# Patient Record
Sex: Male | Born: 1950 | Race: White | Hispanic: No | Marital: Married | Smoking: Former smoker
Health system: Southern US, Community
[De-identification: ages and names within clinical notes are randomized; demographics above are authoritative.]

## PROBLEM LIST (undated history)

## (undated) DIAGNOSIS — M48062 Spinal stenosis, lumbar region with neurogenic claudication: Secondary | ICD-10-CM

## (undated) DIAGNOSIS — C801 Malignant (primary) neoplasm, unspecified: Secondary | ICD-10-CM

## (undated) DIAGNOSIS — N189 Chronic kidney disease, unspecified: Secondary | ICD-10-CM

## (undated) DIAGNOSIS — E785 Hyperlipidemia, unspecified: Secondary | ICD-10-CM

## (undated) DIAGNOSIS — F4310 Post-traumatic stress disorder, unspecified: Secondary | ICD-10-CM

## (undated) DIAGNOSIS — M1990 Unspecified osteoarthritis, unspecified site: Secondary | ICD-10-CM

## (undated) DIAGNOSIS — M109 Gout, unspecified: Secondary | ICD-10-CM

## (undated) DIAGNOSIS — N529 Male erectile dysfunction, unspecified: Secondary | ICD-10-CM

## (undated) DIAGNOSIS — M5030 Other cervical disc degeneration, unspecified cervical region: Secondary | ICD-10-CM

## (undated) DIAGNOSIS — R51 Headache: Secondary | ICD-10-CM

## (undated) DIAGNOSIS — N400 Enlarged prostate without lower urinary tract symptoms: Secondary | ICD-10-CM

## (undated) HISTORY — PX: OTHER SURGICAL HISTORY: SHX169

## (undated) HISTORY — PX: SHOULDER ARTHROSCOPY: SHX128

## (undated) HISTORY — PX: ROTATOR CUFF REPAIR: SHX139

## (undated) HISTORY — DX: Benign prostatic hyperplasia without lower urinary tract symptoms: N40.0

## (undated) HISTORY — PX: JOINT REPLACEMENT: SHX530

## (undated) HISTORY — PX: SHOULDER SURGERY: SHX246

## (undated) HISTORY — DX: Gout, unspecified: M10.9

## (undated) HISTORY — DX: Hyperlipidemia, unspecified: E78.5

## (undated) HISTORY — PX: TONSILLECTOMY: SUR1361

---

## 1999-04-12 DIAGNOSIS — C649 Malignant neoplasm of unspecified kidney, except renal pelvis: Secondary | ICD-10-CM

## 1999-04-12 DIAGNOSIS — C801 Malignant (primary) neoplasm, unspecified: Secondary | ICD-10-CM

## 1999-04-12 HISTORY — PX: NEPHRECTOMY: SHX65

## 1999-04-12 HISTORY — DX: Malignant (primary) neoplasm, unspecified: C80.1

## 1999-04-12 HISTORY — DX: Malignant neoplasm of unspecified kidney, except renal pelvis: C64.9

## 2006-03-29 ENCOUNTER — Ambulatory Visit: Payer: Self-pay

## 2006-05-18 ENCOUNTER — Ambulatory Visit: Payer: Self-pay

## 2009-04-11 HISTORY — PX: HIP ARTHROPLASTY: SHX981

## 2010-01-20 ENCOUNTER — Ambulatory Visit: Payer: Self-pay | Admitting: Sports Medicine

## 2010-04-11 HISTORY — PX: TOTAL HIP REVISION: SHX763

## 2010-10-05 ENCOUNTER — Ambulatory Visit: Payer: Self-pay

## 2011-01-12 ENCOUNTER — Emergency Department: Payer: Self-pay | Admitting: *Deleted

## 2011-03-17 ENCOUNTER — Ambulatory Visit: Payer: Self-pay | Admitting: Sports Medicine

## 2011-08-31 DIAGNOSIS — M7061 Trochanteric bursitis, right hip: Secondary | ICD-10-CM | POA: Insufficient documentation

## 2011-12-01 ENCOUNTER — Ambulatory Visit: Payer: Self-pay | Admitting: Orthopedic Surgery

## 2011-12-01 LAB — CBC
MCH: 29.9 pg (ref 26.0–34.0)
MCHC: 33.7 g/dL (ref 32.0–36.0)
Platelet: 189 10*3/uL (ref 150–440)
RBC: 5.17 10*6/uL (ref 4.40–5.90)
WBC: 10.1 10*3/uL (ref 3.8–10.6)

## 2011-12-01 LAB — BASIC METABOLIC PANEL
Anion Gap: 7 (ref 7–16)
BUN: 17 mg/dL (ref 7–18)
Calcium, Total: 9.6 mg/dL (ref 8.5–10.1)
EGFR (African American): >60
EGFR (Non-African Amer.): >60
Glucose: 83 mg/dL (ref 65–99)
Osmolality: 282 (ref 275–301)
Sodium: 141 mmol/L (ref 136–145)

## 2011-12-01 LAB — APTT: Activated PTT: 32 secs (ref 23.6–35.9)

## 2011-12-08 ENCOUNTER — Inpatient Hospital Stay: Payer: Self-pay | Admitting: Orthopedic Surgery

## 2011-12-09 LAB — PLATELET COUNT: Platelet: 181 10*3/uL (ref 150–440)

## 2011-12-09 LAB — BASIC METABOLIC PANEL
Anion Gap: 8 (ref 7–16)
BUN: 13 mg/dL (ref 7–18)
Chloride: 104 mmol/L (ref 98–107)
Co2: 26 mmol/L (ref 21–32)
Creatinine: 1.17 mg/dL (ref 0.60–1.30)
EGFR (Non-African Amer.): >60
Potassium: 4.4 mmol/L (ref 3.5–5.1)

## 2011-12-09 LAB — HEMOGLOBIN: HGB: 13.1 g/dL (ref 13.0–18.0)

## 2011-12-10 LAB — HEMOGLOBIN: HGB: 12.6 g/dL — ABNORMAL LOW (ref 13.0–18.0)

## 2011-12-13 LAB — PATHOLOGY REPORT

## 2012-08-07 ENCOUNTER — Ambulatory Visit: Payer: Self-pay | Admitting: Orthopedic Surgery

## 2012-08-07 LAB — BASIC METABOLIC PANEL
Anion Gap: 7 (ref 7–16)
BUN: 15 mg/dL (ref 7–18)
Calcium, Total: 8.7 mg/dL (ref 8.5–10.1)
Chloride: 105 mmol/L (ref 98–107)
Co2: 26 mmol/L (ref 21–32)
Creatinine: 1.17 mg/dL (ref 0.60–1.30)
EGFR (African American): >60
EGFR (Non-African Amer.): >60
Potassium: 4.1 mmol/L (ref 3.5–5.1)
Sodium: 138 mmol/L (ref 136–145)

## 2012-08-07 LAB — CBC
HCT: 43.7 % (ref 40.0–52.0)
HGB: 14.8 g/dL (ref 13.0–18.0)
MCHC: 33.9 g/dL (ref 32.0–36.0)
RDW: 14 % (ref 11.5–14.5)
WBC: 8.9 10*3/uL (ref 3.8–10.6)

## 2012-08-07 LAB — MRSA PCR SCREENING

## 2012-08-07 LAB — PROTIME-INR: Prothrombin Time: 13.3 secs (ref 11.5–14.7)

## 2012-08-07 LAB — SEDIMENTATION RATE: Erythrocyte Sed Rate: 3 mm/hr (ref 0–20)

## 2012-08-21 ENCOUNTER — Inpatient Hospital Stay: Payer: Self-pay | Admitting: Orthopedic Surgery

## 2012-08-22 LAB — BASIC METABOLIC PANEL
BUN: 15 mg/dL (ref 7–18)
Calcium, Total: 7.8 mg/dL — ABNORMAL LOW (ref 8.5–10.1)
Chloride: 107 mmol/L (ref 98–107)
Co2: 24 mmol/L (ref 21–32)
Creatinine: 1.43 mg/dL — ABNORMAL HIGH (ref 0.60–1.30)
EGFR (Non-African Amer.): 53 — ABNORMAL LOW
Glucose: 120 mg/dL — ABNORMAL HIGH (ref 65–99)
Potassium: 4.2 mmol/L (ref 3.5–5.1)
Sodium: 138 mmol/L (ref 136–145)

## 2012-08-22 LAB — PLATELET COUNT: Platelet: 162 10*3/uL (ref 150–440)

## 2012-08-23 LAB — HEMOGLOBIN: HGB: 9.4 g/dL — ABNORMAL LOW (ref 13.0–18.0)

## 2012-08-23 LAB — BASIC METABOLIC PANEL
BUN: 13 mg/dL (ref 7–18)
Chloride: 107 mmol/L (ref 98–107)
Co2: 28 mmol/L (ref 21–32)
EGFR (African American): >60
EGFR (Non-African Amer.): >60
Osmolality: 278 (ref 275–301)
Potassium: 3.9 mmol/L (ref 3.5–5.1)
Sodium: 139 mmol/L (ref 136–145)

## 2012-08-24 LAB — HEMOGLOBIN: HGB: 8.3 g/dL — ABNORMAL LOW

## 2013-02-21 ENCOUNTER — Other Ambulatory Visit: Payer: Self-pay | Admitting: Orthopedic Surgery

## 2013-02-21 NOTE — Progress Notes (Signed)
Preoperative surgical orders have been place into the Epic hospital system for Paul Byrd on 02/21/2013, 10:49 AM  by Patrica Duel for surgery on 03/06/2013.  Preop Total Hip - Anterior Approach orders including Experel Injecion, PO Tylenol, and IV Decadron as long as there are no contraindications to the above medications. Avel Peace, PA-C

## 2013-02-28 ENCOUNTER — Encounter (HOSPITAL_COMMUNITY): Payer: Self-pay | Admitting: Pharmacy Technician

## 2013-02-28 ENCOUNTER — Other Ambulatory Visit (HOSPITAL_COMMUNITY): Payer: Self-pay | Admitting: Orthopedic Surgery

## 2013-02-28 NOTE — Progress Notes (Signed)
OV with clearance Dr Sampson Goon 11/14 chart,  Old EKG 2/13 chart

## 2013-02-28 NOTE — Patient Instructions (Addendum)
20 SHAMOND SKELTON  02/28/2013   Your procedure is scheduled on:  03/06/13  [facility]  Report to [facility] at  1200     NOON  Call this number if you have problems the morning of surgery: [phone]       Remember:   Do not eat food :After Midnight. Tuesday NIGHT--- MAY HAVE CLEAR LIQUIDS Wednesday MORNING UNTIL 0830am- THEN NOTHING BY MOUTH   Take these medicines the morning of surgery with A SIP OF WATER: May take Percocet if needed   .  Contacts, dentures or partial plates can not be worn to surgery  Leave suitcase in the car. After surgery it may be brought to your room.  For patients admitted to the hospital, checkout time is 11:00 AM day of  discharge.             SPECIAL INSTRUCTIONS- SEE [facility] PREPARING FOR SURGERY INSTRUCTION SHEET-     DO NOT WEAR JEWELRY, LOTIONS, POWDERS, OR PERFUMES.  WOMEN-- DO NOT SHAVE LEGS OR UNDERARMS FOR 12 HOURS BEFORE SHOWERS. MEN MAY SHAVE FACE.  Patients discharged the day of surgery will not be allowed to drive home. IF going home the day of surgery, you must have a driver and someone to stay with you for the first 24 hours  Name and phone number of your driver:                                                                        Please read over the following fact sheets that you were given: MRSA Information, Incentive Spirometry Sheet, Blood Transfusion Sheet  Information                                                                                   Kaitlin Alcindor,RN,BSN  PST [phone]  [id number]                 FAILURE TO FOLLOW THESE INSTRUCTIONS MAY RESULT IN  CANCELLATION   OF YOUR SURGERY                                                  Patient Signature _____________________________

## 2013-03-01 ENCOUNTER — Encounter (HOSPITAL_COMMUNITY): Payer: Self-pay

## 2013-03-01 ENCOUNTER — Encounter (HOSPITAL_COMMUNITY)
Admission: RE | Admit: 2013-03-01 | Discharge: 2013-03-01 | Disposition: A | Discharge status: Home / Self Care | Payer: BC Managed Care – PPO | Source: Ambulatory Visit | Attending: Orthopedic Surgery | Admitting: Orthopedic Surgery

## 2013-03-01 ENCOUNTER — Ambulatory Visit (HOSPITAL_COMMUNITY)
Admission: RE | Admit: 2013-03-01 | Discharge: 2013-03-01 | Disposition: A | Discharge status: Home / Self Care | Payer: BC Managed Care – PPO | Source: Ambulatory Visit | Attending: Orthopedic Surgery | Admitting: Orthopedic Surgery

## 2013-03-01 DIAGNOSIS — Z96649 Presence of unspecified artificial hip joint: Secondary | ICD-10-CM | POA: Insufficient documentation

## 2013-03-01 DIAGNOSIS — Z01818 Encounter for other preprocedural examination: Secondary | ICD-10-CM | POA: Insufficient documentation

## 2013-03-01 DIAGNOSIS — M1610 Unilateral primary osteoarthritis, unspecified hip: Secondary | ICD-10-CM | POA: Insufficient documentation

## 2013-03-01 DIAGNOSIS — M169 Osteoarthritis of hip, unspecified: Secondary | ICD-10-CM | POA: Insufficient documentation

## 2013-03-01 DIAGNOSIS — Z01812 Encounter for preprocedural laboratory examination: Secondary | ICD-10-CM | POA: Insufficient documentation

## 2013-03-01 HISTORY — DX: Chronic kidney disease, unspecified: N18.9

## 2013-03-01 HISTORY — DX: Headache: R51

## 2013-03-01 HISTORY — DX: Malignant (primary) neoplasm, unspecified: C80.1

## 2013-03-01 HISTORY — DX: Unspecified osteoarthritis, unspecified site: M19.90

## 2013-03-01 LAB — URINALYSIS, ROUTINE W REFLEX MICROSCOPIC
Bilirubin Urine: NEGATIVE
Glucose, UA: NEGATIVE mg/dL
Ketones, ur: NEGATIVE mg/dL
Nitrite: NEGATIVE
Protein, ur: NEGATIVE mg/dL
Specific Gravity, Urine: 1.019 (ref 1.005–1.030)
Urobilinogen, UA: 0.2 mg/dL (ref 0.0–1.0)
pH: 5.5 (ref 5.0–8.0)

## 2013-03-01 LAB — CBC
Hemoglobin: 15.1 g/dL (ref 13.0–17.0)
MCH: 28.3 pg (ref 26.0–34.0)
MCHC: 33.4 g/dL (ref 30.0–36.0)
RDW: 14.8 % (ref 11.5–15.5)

## 2013-03-01 LAB — COMPREHENSIVE METABOLIC PANEL
ALT: 18 U/L (ref 0–53)
Alkaline Phosphatase: 129 U/L — ABNORMAL HIGH (ref 39–117)
Calcium: 9.9 mg/dL (ref 8.4–10.5)
Creatinine, Ser: 1.14 mg/dL (ref 0.50–1.35)
GFR calc Af Amer: 79 mL/min — ABNORMAL LOW (ref >90)
GFR calc non Af Amer: 68 mL/min — ABNORMAL LOW (ref >90)
Glucose, Bld: 95 mg/dL (ref 70–99)
Potassium: 4.5 mEq/L (ref 3.5–5.1)
Sodium: 138 mEq/L (ref 135–145)
Total Protein: 7.4 g/dL (ref 6.0–8.3)

## 2013-03-01 LAB — SURGICAL PCR SCREEN
MRSA, PCR: NEGATIVE
Staphylococcus aureus: POSITIVE — AB

## 2013-03-01 LAB — PROTIME-INR
INR: 0.98 (ref 0.00–1.49)
Prothrombin Time: 12.8 seconds (ref 11.6–15.2)

## 2013-03-01 NOTE — Progress Notes (Signed)
03/01/13 1058  OBSTRUCTIVE SLEEP APNEA  Have you ever been diagnosed with sleep apnea through a sleep study? No  Do you snore loudly (loud enough to be heard through closed doors)?  0  Do you often feel tired, fatigued, or sleepy during the daytime? 1  Has anyone observed you stop breathing during your sleep? 0  Do you have, or are you being treated for high blood pressure? 0  BMI more than 35 kg/m2? 1  Age over 60 years old? 1  Neck circumference greater than 40 cm/18 inches? 1  Gender: 1  Obstructive Sleep Apnea Score 5  Score 4 or greater  Results sent to PCP

## 2013-03-03 ENCOUNTER — Other Ambulatory Visit: Payer: Self-pay | Admitting: Orthopedic Surgery

## 2013-03-03 NOTE — H&P (Signed)
Paul Byrd  DOB: 09/25/1950 Married / Language: English / Race: White Male  Date of Admission:  03/06/2013  Chief Complaint:  Left Hip Pain  History of Present Illness The patient is a 62 year old male who comes in for a preoperative History and Physical. The patient is scheduled for a left total hip arthroplasty (anterior approach) to be performed by Dr. Frank V. [name], MD at [facility] on 03/06/2013. The patient is a 62 year old male who presents for follow up of their hip. The patient is being followed for their left hip pain. Symptoms reported today include: pain (worse since last visit). The patient feels that they are doing poorly.  Unfortunately, his left hip is getting progressively worse. He's having pain at rest as well as with attempted activity. It is limiting what he can and cannot do. 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 He feels like that is doing fairly well now. He mainly feels that the right leg is longer than the left. He is ready to proceed with surgery. They have been treated conservatively in the past for the above stated problem and despite conservative measures, they continue to have progressive pain and severe functional limitations and dysfunction. They have failed non-operative management including home exercise, medications, and injections. It is felt that they would benefit from undergoing total joint replacement. Risks and benefits of the procedure have been discussed with the patient and they elect to proceed with surgery. There are no active contraindications to surgery such as ongoing infection or rapidly progressive neurological disease.   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 Mother. mother Cerebrovascular Accident. father Hypertension. mother and father Chronic Obstructive Lung Disease.  mother Diabetes Mellitus. father Heart Disease. father   Social History Tobacco use. Former smoker. former smoker; smoke(d) 3 or more pack(s) per day Tobacco / smoke exposure. yes outdoors only Previously in rehab. no Current work status. working full time Pain Contract. yes Number of flights of stairs before winded. 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 married Illicit drug use. no Exercise. Exercises daily; does running / walking Living situation. live with spouse Children. 3 Alcohol use. current drinker; drinks hard liquor; only occasionally per week Drug/Alcohol Rehab (Currently). no Post-Surgical Plans. Home    Medication History OxyCODONE HCl (5MG Tablet, Oral) Active. Allopurinol ( Oral) Specific dose unknown - Active.    Past Surgical History Kidney Removal. left Colon Polyp Removal - Colonoscopy Tonsillectomy Total Hip Replacement. right Arthroscopy of Shoulder. bilateral Rotator Cuff Repair. bilateral   Medical History Other disease, cancer, significant illness. Kidney cancer Gout    Review of Systems General:Not Present- Chills, Fever, Night Sweats, Fatigue, Weight Gain, Weight Loss and Memory Loss. Skin:Not Present- Hives, Itching, Rash, Eczema and Lesions. HEENT:Not Present- Tinnitus, Headache, Double Vision, Visual Loss, Hearing Loss and Dentures. Respiratory:Present- Cough (recent URI). Not Present- Shortness of breath with exertion, Shortness of breath at rest, Allergies, Coughing up blood and Chronic Cough. Cardiovascular:Not Present- Chest Pain, Racing/skipping heartbeats, Difficulty Breathing Lying Down, Murmur, Swelling and Palpitations. Gastrointestinal:Not Present- Bloody Stool, Heartburn, Abdominal Pain, Vomiting, Nausea, Constipation, Diarrhea, Difficulty Swallowing, Jaundice and Loss of appetitie. Male Genitourinary:Not Present- Urinary frequency, Blood in Urine,  Weak urinary stream, Discharge, Flank Pain, Incontinence, Painful Urination, Urgency, Urinary Retention and Urinating at Night. Musculoskeletal:Present- Joint Pain. Not Present- Muscle Weakness, Muscle Pain, Joint Swelling, Back   Pain, Morning Stiffness and Spasms. Neurological:Not Present- Tremor, Dizziness, Blackout spells, Paralysis, Difficulty with balance and Weakness. Psychiatric:Not Present- Insomnia.    Vitals Pulse: 76 (Regular) Resp.: 16 (Unlabored) BP: 112/68 (Sitting, Right Arm, Standard)   Physical Exam The physical exam findings are as follows:   General Mental Status - Alert, cooperative and good historian. General Appearance- pleasant. Not in acute distress. Orientation- Oriented X3. Build & Nutrition- Well nourished and Well developed.   Head and Neck Head- normocephalic, atraumatic . Neck Global Assessment- supple. no bruit auscultated on the right and no bruit auscultated on the left. upper denture work  Eye Pupil- Bilateral- Regular and Round. Motion- Bilateral- EOMI.   Chest and Lung Exam Auscultation: Breath sounds:- clear at anterior chest wall and - clear at posterior chest wall. Adventitious sounds:- No Adventitious sounds.   Cardiovascular Auscultation:Rhythm- Regular rate and rhythm. Heart Sounds- S1 WNL and S2 WNL. Murmurs & Other Heart Sounds:Auscultation of the heart reveals - No Murmurs.   Abdomen Inspection:Contour- Generalized mild distention. Palpation/Percussion:Tenderness- Abdomen is non-tender to palpation. Rigidity (guarding)- Abdomen is soft. Auscultation:Auscultation of the abdomen reveals - Bowel sounds normal.   Male Genitourinary Not done, not pertinent to present illness  Musculoskeletal On exam, he's in no distress. Evaluation of his left hip shows flexion about 100, rotation in 20, out 30, abduction 30 but with pain on any attempted motion.  RADIOGRAPHS: His plain  radiographs from last visit showed definite narrowing of the left hip joint but not completely bone on bone. His MRI shows a significant stress reaction of the femoral head with bone on bone change in that hip and an effusion.   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 [name].  Plan is to go home.  PCP - Dr. David Fitzgerald - Patient has been seen preoperatively and felt to be stable for surgery.  The patient will not receive TXA (tranexamic acid) due to: Renal Cancer  Signed electronically by Tacora Athanas L Babara Buffalo, III PA-C  

## 2013-03-06 ENCOUNTER — Encounter (HOSPITAL_COMMUNITY): Payer: BC Managed Care – PPO | Admitting: Anesthesiology

## 2013-03-06 ENCOUNTER — Inpatient Hospital Stay (HOSPITAL_COMMUNITY): Payer: BC Managed Care – PPO

## 2013-03-06 ENCOUNTER — Encounter (HOSPITAL_COMMUNITY)
Admission: RE | Disposition: A | Discharge status: Home Health | Payer: Self-pay | Source: Ambulatory Visit | Attending: Orthopedic Surgery

## 2013-03-06 ENCOUNTER — Inpatient Hospital Stay (HOSPITAL_COMMUNITY)
Admission: RE | Admit: 2013-03-06 | Discharge: 2013-03-08 | DRG: 470 | Disposition: A | Discharge status: Home Health | Payer: BC Managed Care – PPO | Source: Ambulatory Visit | Attending: Orthopedic Surgery | Admitting: Orthopedic Surgery

## 2013-03-06 ENCOUNTER — Inpatient Hospital Stay (HOSPITAL_COMMUNITY): Payer: BC Managed Care – PPO | Admitting: Anesthesiology

## 2013-03-06 ENCOUNTER — Encounter (HOSPITAL_COMMUNITY): Payer: Self-pay | Admitting: *Deleted

## 2013-03-06 DIAGNOSIS — Z85528 Personal history of other malignant neoplasm of kidney: Secondary | ICD-10-CM

## 2013-03-06 DIAGNOSIS — M1610 Unilateral primary osteoarthritis, unspecified hip: Principal | ICD-10-CM | POA: Diagnosis present

## 2013-03-06 DIAGNOSIS — Z8249 Family history of ischemic heart disease and other diseases of the circulatory system: Secondary | ICD-10-CM

## 2013-03-06 DIAGNOSIS — Z833 Family history of diabetes mellitus: Secondary | ICD-10-CM

## 2013-03-06 DIAGNOSIS — Z87891 Personal history of nicotine dependence: Secondary | ICD-10-CM

## 2013-03-06 DIAGNOSIS — M169 Osteoarthritis of hip, unspecified: Principal | ICD-10-CM | POA: Diagnosis present

## 2013-03-06 DIAGNOSIS — Z905 Acquired absence of kidney: Secondary | ICD-10-CM

## 2013-03-06 DIAGNOSIS — Z860100 Personal history of colon polyps, unspecified: Secondary | ICD-10-CM

## 2013-03-06 DIAGNOSIS — Z823 Family history of stroke: Secondary | ICD-10-CM

## 2013-03-06 HISTORY — PX: TOTAL HIP ARTHROPLASTY: SHX124

## 2013-03-06 LAB — TYPE AND SCREEN: ABO/RH(D): O POS

## 2013-03-06 LAB — ABO/RH: ABO/RH(D): O POS

## 2013-03-06 SURGERY — ARTHROPLASTY, HIP, TOTAL, ANTERIOR APPROACH
Anesthesia: Spinal | Site: Hip | Laterality: Left | Wound class: Clean

## 2013-03-06 MED ORDER — DEXAMETHASONE SODIUM PHOSPHATE 10 MG/ML IJ SOLN
10.0000 mg | Freq: Every day | INTRAMUSCULAR | Status: AC
  Filled 2013-03-06: qty 1

## 2013-03-06 MED ORDER — BUPIVACAINE HCL (PF) 0.25 % IJ SOLN
INTRAMUSCULAR | Status: AC
  Filled 2013-03-06: qty 30

## 2013-03-06 MED ORDER — PHENOL 1.4 % MT LIQD
1.0000 | OROMUCOSAL | Status: DC | PRN
  Filled 2013-03-06: qty 177

## 2013-03-06 MED ORDER — TRAMADOL HCL 50 MG PO TABS: 50.0000 mg | ORAL_TABLET | Freq: Four times a day (QID) | ORAL | Status: DC | PRN

## 2013-03-06 MED ORDER — METOCLOPRAMIDE HCL 5 MG/ML IJ SOLN: 5.0000 mg | Freq: Three times a day (TID) | INTRAMUSCULAR | Status: DC | PRN

## 2013-03-06 MED ORDER — ONDANSETRON HCL 4 MG/2ML IJ SOLN
INTRAMUSCULAR | Status: AC
  Filled 2013-03-06: qty 2

## 2013-03-06 MED ORDER — LIDOCAINE HCL (CARDIAC) 20 MG/ML IV SOLN
INTRAVENOUS | Status: AC
  Filled 2013-03-06: qty 5

## 2013-03-06 MED ORDER — BUPIVACAINE HCL 0.25 % IJ SOLN
INTRAMUSCULAR | Status: DC | PRN
  Administered 2013-03-06: 20 mL

## 2013-03-06 MED ORDER — KETOROLAC TROMETHAMINE 15 MG/ML IJ SOLN: 7.5000 mg | Freq: Four times a day (QID) | INTRAMUSCULAR | Status: AC | PRN

## 2013-03-06 MED ORDER — LIDOCAINE HCL (CARDIAC) 20 MG/ML IV SOLN
INTRAVENOUS | Status: DC | PRN
  Administered 2013-03-06: 25 mg via INTRAVENOUS

## 2013-03-06 MED ORDER — DEXAMETHASONE SODIUM PHOSPHATE 10 MG/ML IJ SOLN: 10.0000 mg | Freq: Once | INTRAMUSCULAR | Status: DC

## 2013-03-06 MED ORDER — METOCLOPRAMIDE HCL 10 MG PO TABS: 5.0000 mg | ORAL_TABLET | Freq: Three times a day (TID) | ORAL | Status: DC | PRN

## 2013-03-06 MED ORDER — PROPOFOL 10 MG/ML IV BOLUS
INTRAVENOUS | Status: AC
  Filled 2013-03-06: qty 20

## 2013-03-06 MED ORDER — EPHEDRINE SULFATE 50 MG/ML IJ SOLN
INTRAMUSCULAR | Status: AC
  Filled 2013-03-06: qty 1

## 2013-03-06 MED ORDER — DOCUSATE SODIUM 100 MG PO CAPS
100.0000 mg | ORAL_CAPSULE | Freq: Two times a day (BID) | ORAL | Status: DC
  Administered 2013-03-06 – 2013-03-08 (×3): 100 mg via ORAL

## 2013-03-06 MED ORDER — SODIUM CHLORIDE 0.9 % IJ SOLN
INTRAMUSCULAR | Status: AC
  Filled 2013-03-06: qty 50

## 2013-03-06 MED ORDER — PROMETHAZINE HCL 25 MG/ML IJ SOLN: 6.2500 mg | INTRAMUSCULAR | Status: DC | PRN

## 2013-03-06 MED ORDER — BUPIVACAINE HCL (PF) 0.5 % IJ SOLN
INTRAMUSCULAR | Status: AC
  Filled 2013-03-06: qty 30

## 2013-03-06 MED ORDER — CEFAZOLIN SODIUM-DEXTROSE 2-3 GM-% IV SOLR
2.0000 g | Freq: Four times a day (QID) | INTRAVENOUS | Status: AC
  Administered 2013-03-06 – 2013-03-07 (×2): 2 g via INTRAVENOUS
  Filled 2013-03-06 (×2): qty 50

## 2013-03-06 MED ORDER — ACETAMINOPHEN 650 MG RE SUPP: 650.0000 mg | Freq: Four times a day (QID) | RECTAL | Status: DC | PRN

## 2013-03-06 MED ORDER — ACETAMINOPHEN 500 MG PO TABS
1000.0000 mg | ORAL_TABLET | Freq: Four times a day (QID) | ORAL | Status: AC
  Administered 2013-03-06 – 2013-03-07 (×4): 1000 mg via ORAL
  Filled 2013-03-06 (×5): qty 2

## 2013-03-06 MED ORDER — ACETAMINOPHEN 500 MG PO TABS
1000.0000 mg | ORAL_TABLET | Freq: Once | ORAL | Status: AC
  Administered 2013-03-06: 1000 mg via ORAL
  Filled 2013-03-06: qty 2

## 2013-03-06 MED ORDER — HYDROMORPHONE HCL PF 1 MG/ML IJ SOLN: 0.2500 mg | INTRAMUSCULAR | Status: DC | PRN

## 2013-03-06 MED ORDER — DIPHENHYDRAMINE HCL 12.5 MG/5ML PO ELIX: 12.5000 mg | ORAL_SOLUTION | ORAL | Status: DC | PRN

## 2013-03-06 MED ORDER — PROPOFOL 10 MG/ML IV BOLUS
INTRAVENOUS | Status: AC
  Filled 2013-03-06: qty 40

## 2013-03-06 MED ORDER — DEXAMETHASONE SODIUM PHOSPHATE 10 MG/ML IJ SOLN
INTRAMUSCULAR | Status: AC
  Filled 2013-03-06: qty 1

## 2013-03-06 MED ORDER — PROPOFOL INFUSION 10 MG/ML OPTIME
INTRAVENOUS | Status: DC | PRN
  Administered 2013-03-06: 120 ug/kg/min via INTRAVENOUS

## 2013-03-06 MED ORDER — MIDAZOLAM HCL 5 MG/5ML IJ SOLN
INTRAMUSCULAR | Status: DC | PRN
  Administered 2013-03-06: 2 mg via INTRAVENOUS

## 2013-03-06 MED ORDER — RIVAROXABAN 10 MG PO TABS
10.0000 mg | ORAL_TABLET | Freq: Every day | ORAL | Status: DC
  Administered 2013-03-07 – 2013-03-08 (×2): 10 mg via ORAL
  Filled 2013-03-06 (×3): qty 1

## 2013-03-06 MED ORDER — ALLOPURINOL 300 MG PO TABS
300.0000 mg | ORAL_TABLET | Freq: Every day | ORAL | Status: DC
  Administered 2013-03-07 – 2013-03-08 (×2): 300 mg via ORAL
  Filled 2013-03-06 (×3): qty 1

## 2013-03-06 MED ORDER — ACETAMINOPHEN 325 MG PO TABS: 650.0000 mg | ORAL_TABLET | Freq: Four times a day (QID) | ORAL | Status: DC | PRN

## 2013-03-06 MED ORDER — METHOCARBAMOL 500 MG PO TABS: 500.0000 mg | ORAL_TABLET | Freq: Four times a day (QID) | ORAL | Status: DC | PRN

## 2013-03-06 MED ORDER — METHOCARBAMOL 100 MG/ML IJ SOLN
500.0000 mg | Freq: Four times a day (QID) | INTRAVENOUS | Status: DC | PRN
  Administered 2013-03-06: 500 mg via INTRAVENOUS
  Filled 2013-03-06 (×2): qty 5

## 2013-03-06 MED ORDER — BUPIVACAINE LIPOSOME 1.3 % IJ SUSP
20.0000 mL | Freq: Once | INTRAMUSCULAR | Status: DC
  Filled 2013-03-06: qty 20

## 2013-03-06 MED ORDER — FENTANYL CITRATE 0.05 MG/ML IJ SOLN
INTRAMUSCULAR | Status: DC | PRN
Start: 2013-03-06 — End: 2013-03-06
  Administered 2013-03-06 (×2): 50 ug via INTRAVENOUS

## 2013-03-06 MED ORDER — FLEET ENEMA 7-19 GM/118ML RE ENEM: 1.0000 | ENEMA | Freq: Once | RECTAL | Status: AC | PRN

## 2013-03-06 MED ORDER — EPHEDRINE SULFATE 50 MG/ML IJ SOLN
INTRAMUSCULAR | Status: DC | PRN
  Administered 2013-03-06 (×3): 5 mg via INTRAVENOUS

## 2013-03-06 MED ORDER — 0.9 % SODIUM CHLORIDE (POUR BTL) OPTIME
TOPICAL | Status: DC | PRN
  Administered 2013-03-06: 1000 mL

## 2013-03-06 MED ORDER — SODIUM CHLORIDE 0.9 % IJ SOLN
INTRAMUSCULAR | Status: DC | PRN
  Administered 2013-03-06: 17:00:00

## 2013-03-06 MED ORDER — MENTHOL 3 MG MT LOZG
1.0000 | LOZENGE | OROMUCOSAL | Status: DC | PRN
  Filled 2013-03-06: qty 9

## 2013-03-06 MED ORDER — FENTANYL CITRATE 0.05 MG/ML IJ SOLN
INTRAMUSCULAR | Status: AC
  Filled 2013-03-06: qty 2

## 2013-03-06 MED ORDER — BISACODYL 10 MG RE SUPP: 10.0000 mg | Freq: Every day | RECTAL | Status: DC | PRN

## 2013-03-06 MED ORDER — CEFAZOLIN SODIUM-DEXTROSE 2-3 GM-% IV SOLR
2.0000 g | INTRAVENOUS | Status: AC
  Administered 2013-03-06: 2 g via INTRAVENOUS

## 2013-03-06 MED ORDER — LACTATED RINGERS IV SOLN
INTRAVENOUS | Status: DC
  Administered 2013-03-06: 15:00:00 via INTRAVENOUS
  Administered 2013-03-06: 1000 mL via INTRAVENOUS
  Administered 2013-03-06 (×2): via INTRAVENOUS

## 2013-03-06 MED ORDER — MORPHINE SULFATE 2 MG/ML IJ SOLN: 1.0000 mg | INTRAMUSCULAR | Status: DC | PRN

## 2013-03-06 MED ORDER — SODIUM CHLORIDE 0.9 % IJ SOLN
INTRAMUSCULAR | Status: AC
  Filled 2013-03-06: qty 10

## 2013-03-06 MED ORDER — DEXAMETHASONE 6 MG PO TABS
10.0000 mg | ORAL_TABLET | Freq: Every day | ORAL | Status: AC
  Administered 2013-03-07: 10:00:00 10 mg via ORAL
  Filled 2013-03-06: qty 1

## 2013-03-06 MED ORDER — BUPIVACAINE HCL (PF) 0.5 % IJ SOLN
INTRAMUSCULAR | Status: DC | PRN
  Administered 2013-03-06: 15 mg

## 2013-03-06 MED ORDER — ONDANSETRON HCL 4 MG/2ML IJ SOLN: 4.0000 mg | Freq: Four times a day (QID) | INTRAMUSCULAR | Status: DC | PRN

## 2013-03-06 MED ORDER — DEXTROSE-NACL 5-0.9 % IV SOLN
INTRAVENOUS | Status: DC
  Administered 2013-03-06 – 2013-03-07 (×2): via INTRAVENOUS

## 2013-03-06 MED ORDER — OXYCODONE HCL 5 MG PO TABS
5.0000 mg | ORAL_TABLET | ORAL | Status: DC | PRN
  Administered 2013-03-07: 5 mg via ORAL
  Filled 2013-03-06: qty 2

## 2013-03-06 MED ORDER — CEFAZOLIN SODIUM-DEXTROSE 2-3 GM-% IV SOLR
INTRAVENOUS | Status: AC
  Filled 2013-03-06: qty 50

## 2013-03-06 MED ORDER — MIDAZOLAM HCL 2 MG/2ML IJ SOLN
INTRAMUSCULAR | Status: AC
  Filled 2013-03-06: qty 2

## 2013-03-06 MED ORDER — ONDANSETRON HCL 4 MG PO TABS: 4.0000 mg | ORAL_TABLET | Freq: Four times a day (QID) | ORAL | Status: DC | PRN

## 2013-03-06 MED ORDER — SODIUM CHLORIDE 0.9 % IV SOLN: INTRAVENOUS | Status: DC

## 2013-03-06 MED ORDER — ONDANSETRON HCL 4 MG/2ML IJ SOLN
INTRAMUSCULAR | Status: DC | PRN
  Administered 2013-03-06: 4 mg via INTRAVENOUS

## 2013-03-06 MED ORDER — POLYETHYLENE GLYCOL 3350 17 G PO PACK: 17.0000 g | PACK | Freq: Every day | ORAL | Status: DC | PRN

## 2013-03-06 SURGICAL SUPPLY — 41 items
BAG ZIPLOCK 12X15 (MISCELLANEOUS) ×4 IMPLANT
BLADE SAW SGTL 18X1.27X75 (BLADE) ×2 IMPLANT
CAPT HIP PF COP ×2 IMPLANT
CLOSURE STERI-STRIP 1/4X4 (GAUZE/BANDAGES/DRESSINGS) ×2 IMPLANT
DECANTER SPIKE VIAL GLASS SM (MISCELLANEOUS) ×2 IMPLANT
DRAPE C-ARM 42X120 X-RAY (DRAPES) ×2 IMPLANT
DRAPE STERI IOBAN 125X83 (DRAPES) ×2 IMPLANT
DRAPE U-SHAPE 47X51 STRL (DRAPES) ×6 IMPLANT
DRSG ADAPTIC 3X8 NADH LF (GAUZE/BANDAGES/DRESSINGS) ×2 IMPLANT
DRSG MEPILEX BORDER 4X4 (GAUZE/BANDAGES/DRESSINGS) ×2 IMPLANT
DRSG MEPILEX BORDER 4X8 (GAUZE/BANDAGES/DRESSINGS) ×2 IMPLANT
DURAPREP 26ML APPLICATOR (WOUND CARE) ×2 IMPLANT
ELECT BLADE 6.5 EXT (BLADE) ×2 IMPLANT
ELECT REM PT RETURN 9FT ADLT (ELECTROSURGICAL) ×2
ELECTRODE REM PT RTRN 9FT ADLT (ELECTROSURGICAL) ×1 IMPLANT
EVACUATOR 1/8 PVC DRAIN (DRAIN) IMPLANT
FACESHIELD LNG OPTICON STERILE (SAFETY) ×8 IMPLANT
GLOVE BIO SURGEON STRL SZ7.5 (GLOVE) ×2 IMPLANT
GLOVE BIO SURGEON STRL SZ8 (GLOVE) ×6 IMPLANT
GLOVE BIOGEL PI IND STRL 8 (GLOVE) ×2 IMPLANT
GLOVE BIOGEL PI INDICATOR 8 (GLOVE) ×2
GOWN PREVENTION PLUS LG XLONG (DISPOSABLE) ×2 IMPLANT
GOWN STRL REIN XL XLG (GOWN DISPOSABLE) ×6 IMPLANT
KIT BASIN OR (CUSTOM PROCEDURE TRAY) ×2 IMPLANT
NDL SAFETY ECLIPSE 18X1.5 (NEEDLE) ×2 IMPLANT
NEEDLE HYPO 18GX1.5 SHARP (NEEDLE) ×2
PACK TOTAL JOINT (CUSTOM PROCEDURE TRAY) ×2 IMPLANT
PADDING CAST COTTON 6X4 STRL (CAST SUPPLIES) ×2 IMPLANT
SPONGE GAUZE 4X4 12PLY (GAUZE/BANDAGES/DRESSINGS) IMPLANT
STEM CORAIL KA09 (Stem) IMPLANT
STRIP CLOSURE SKIN 1/2X4 (GAUZE/BANDAGES/DRESSINGS) IMPLANT
SUCTION FRAZIER 12FR DISP (SUCTIONS) IMPLANT
SUT ETHIBOND NAB CT1 #1 30IN (SUTURE) ×2 IMPLANT
SUT MNCRL AB 4-0 PS2 18 (SUTURE) ×2 IMPLANT
SUT VIC AB 2-0 CT1 27 (SUTURE) ×2
SUT VIC AB 2-0 CT1 TAPERPNT 27 (SUTURE) ×2 IMPLANT
SUT VLOC 180 0 24IN GS25 (SUTURE) ×2 IMPLANT
SYR 20CC LL (SYRINGE) ×2 IMPLANT
SYR 50ML LL SCALE MARK (SYRINGE) ×2 IMPLANT
TOWEL OR 17X26 10 PK STRL BLUE (TOWEL DISPOSABLE) ×2 IMPLANT
TRAY FOLEY CATH 14FRSI W/METER (CATHETERS) ×2 IMPLANT

## 2013-03-06 NOTE — Interval H&P Note (Signed)
History and Physical Interval Note:  03/06/2013 1:49 PM  Paul Byrd  has presented today for surgery, with the diagnosis of OA LEFT HIP  The various methods of treatment have been discussed with the patient and family. After consideration of risks, benefits and other options for treatment, the patient has consented to  Procedure(s): LEFT TOTAL HIP ARTHROPLASTY ANTERIOR APPROACH (Left) as a surgical intervention .  The patient's history has been reviewed, patient examined, no change in status, stable for surgery.  I have reviewed the patient's chart and labs.  Questions were answered to the patient's satisfaction.     Loanne Drilling

## 2013-03-06 NOTE — Op Note (Signed)
OPERATIVE REPORT  PREOPERATIVE DIAGNOSIS: Osteoarthritis of the Left hip.   POSTOPERATIVE DIAGNOSIS: Osteoarthritis of the Left  hip.   PROCEDURE: Left total hip arthroplasty, anterior approach.   SURGEON: Ollen Gross, MD   ASSISTANT: Avel Peace, PA-C  ANESTHESIA:  Spinal  ESTIMATED BLOOD LOSS:- 1000 ml  DRAINS: Hemovac x1.   COMPLICATIONS: None   CONDITION: PACU - hemodynamically stable.   BRIEF CLINICAL NOTE: Paul Byrd is a 62 y.o. male who has advanced end-  stage arthritis of his Left  hip with progressively worsening pain and  dysfunction.The patient has failed nonoperative management and presents for  total hip arthroplasty.   PROCEDURE IN DETAIL: After successful administration of spinal  anesthetic, the traction boots for the [facility] bed were placed on both  feet and the patient was placed onto the [facility] bed, boots placed into the leg  holders. The Left hip was then isolated from the perineum with plastic  drapes and prepped and draped in the usual sterile fashion. ASIS and  greater trochanter were marked and a oblique incision was made, starting  at about 1 cm lateral and 2 cm distal to the ASIS and coursing towards  the anterior cortex of the femur. The skin was cut with a 10 blade  through subcutaneous tissue to the level of the fascia overlying the  tensor fascia lata muscle. The fascia was then incised in line with the  incision at the junction of the anterior third and posterior 2/3rd. The  muscle was teased off the fascia and then the interval between the TFL  and the rectus was developed. The Hohmann retractor was then placed at  the top of the femoral neck over the capsule. The vessels overlying the  capsule were cauterized and the fat on top of the capsule was removed.  A Hohmann retractor was then placed anterior underneath the rectus  femoris to give exposure to the entire anterior capsule. A T-shaped  capsulotomy was performed. The  edges were tagged and the femoral head  was identified.       Osteophytes are removed off the superior acetabulum.  The femoral neck was then cut in situ with an oscillating saw. Traction  was then applied to the left lower extremity utilizing the [facility]  traction. The femoral head was then removed. Retractors were placed  around the acetabulum and then circumferential removal of the labrum was  performed. Osteophytes were also removed. Reaming starts at 47 mm to  medialize and  Increased in 2 mm increments to 53 mm. We reamed in  approximately 40 degrees of abduction, 20 degrees anteversion. A 54 mm  pinnacle acetabular shell was then impacted in anatomic position under  fluoroscopic guidance with excellent purchase. We did not need to place  any additional dome screws. A 36 mm neutral + 4 marathon liner was then  placed into the acetabular shell.       The femoral lift was then placed along the lateral aspect of the femur  just distal to the vastus ridge. The leg was  externally rotated and capsule  was stripped off the inferior aspect of the femoral neck down to the  level of the lesser trochanter, this was done with electrocautery. The femur was lifted after this was performed. The  leg was then placed and extended in adducted position to essentially delivering the femur. We also removed the capsule superiorly and the  piriformis from the piriformis  fossa to gain excellent exposure of the  proximal femur. Rongeur was used to remove some cancellous bone to get  into the lateral portion of the proximal femur for placement of the  initial starter reamer. The starter broaches was placed  the starter broach  and was shown to go down the center of the canal. Broaching  with the  Corail system was then performed starting at size 8, coursing  Up to size 9. The 9 was felt to have a tight press fit with good rotational stability. We did a trial reduction with a standard neck and 36 + 1.5 head and on  fluoro the fit appeared excellent. I placed a size 9 Corail stem into the femur and it was rotationally unstable. I removed the stem and then broached up further to an 11 which had excellent torsional and rotational stability and appeared to be a perfect fit on x-ray. A size 11 had excellent torsional and rotational  and axial stability. The trial standard offset neck was then placed  with a 36 + 1.5 trial head. The hip was then reduced. We confirmed that  the stem was in the canal both on AP and lateral x-rays. It also has excellent sizing. The hip was reduced with outstanding stability through full extension, full external rotation,  and then flexion in adduction internal rotation. AP pelvis was taken  and the leg lengths were measured and found to be exactly equal. Hip  was then dislocated again and the femoral head and neck removed. The  femoral broach was removed. Size 11 Corail stem with a standard offset  neck was then impacted into the femur following native anteversion. Has  excellent purchase in the canal. Excellent torsional and rotational and  axial stability. It is confirmed to be in the canal on AP and lateral  fluoroscopic views. The 36 + 1.5 ceramic head was placed and the hip  reduced with outstanding stability. Again AP pelvis was taken and it  confirmed that the leg lengths were equal. The wound was then copiously  irrigated with saline solution and the capsule reattached and repaired  with Ethibond suture.  20 mL of Exparel mixed with 50 mL of saline then additional 20 ml of .25% Bupivicaine injected into the capsule and into the edge of the tensor fascia lata as well as subcutaneous tissue. The fascia overlying the tensor fascia lata was  then closed with a running #1 V-Loc. Subcu was closed with interrupted  2-0 Vicryl and subcuticular running 4-0 Monocryl. Incision was cleaned  and dried. Steri-Strips and a bulky sterile dressing applied. Hemovac  drain was hooked to suction  and then he was awakened and transported to  recovery in stable condition.        Please note that a surgical assistant was a medical necessity for this procedure to perform it in a safe and expeditious manner. Assistant was necessary to provide appropriate retraction of vital neurovascular structures and to prevent femoral fracture and allow for anatomic placement of the prosthesis.  Ollen Gross, M.D.

## 2013-03-06 NOTE — Plan of Care (Signed)
Problem: Consults Goal: Diagnosis- Total Joint Replacement Left anterior hip     

## 2013-03-06 NOTE — Preoperative (Signed)
Beta Blockers   Reason not to administer Beta Blockers:Not Applicable 

## 2013-03-06 NOTE — Anesthesia Preprocedure Evaluation (Signed)
Anesthesia Evaluation  Patient identified by MRN, date of birth, ID band Patient awake    Reviewed: Allergy & Precautions, H&P , NPO status , Patient's Chart, lab work & pertinent test results  Airway Mallampati: II TM Distance: >3 FB Neck ROM: Full    Dental no notable dental hx.    Pulmonary former smoker,  breath sounds clear to auscultation  Pulmonary exam normal       Cardiovascular negative cardio ROS  Rhythm:Regular Rate:Normal     Neuro/Psych negative neurological ROS  negative psych ROS   GI/Hepatic negative GI ROS, Neg liver ROS,   Endo/Other  negative endocrine ROS  Renal/GU Renal diseaseChronic kidney disease   nephrectomy left kidney-cancer   negative genitourinary   Musculoskeletal negative musculoskeletal ROS (+)   Abdominal   Peds negative pediatric ROS (+)  Hematology negative hematology ROS (+)   Anesthesia Other Findings   Reproductive/Obstetrics negative OB ROS                           Anesthesia Physical Anesthesia Plan  ASA: II  Anesthesia Plan: Spinal   Post-op Pain Management:    Induction: Intravenous  Airway Management Planned: Simple Face Mask  Additional Equipment:   Intra-op Plan:   Post-operative Plan:   Informed Consent: I have reviewed the patients History and Physical, chart, labs and discussed the procedure including the risks, benefits and alternatives for the proposed anesthesia with the patient or authorized representative who has indicated his/her understanding and acceptance.   Dental advisory given  Plan Discussed with: CRNA and Surgeon  Anesthesia Plan Comments:         Anesthesia Quick Evaluation

## 2013-03-06 NOTE — H&P (View-Only) (Signed)
Paul Byrd  DOB: 1951/03/31 Married / Language: English / Race: White Male  Date of Admission:  03/06/2013  Chief Complaint:  Left Hip Pain  History of Present Illness The patient is a 62 year old male who comes in for a preoperative History and Physical. The patient is scheduled for a left total hip arthroplasty (anterior approach) to be performed by Dr. [name]. [name], MD at [facility] on 03/06/2013. The patient is a 62 year old male who presents for follow up of their hip. The patient is being followed for their left hip pain. Symptoms reported today include: pain (worse since last visit). The patient feels that they are doing poorly.  Unfortunately, his left hip is getting progressively worse. He's having pain at rest as well as with attempted activity. It is limiting what he can and cannot Byrd. 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 He feels like that is doing fairly well now. He mainly feels that the right leg is longer than the left. He is ready to proceed with surgery. They have been treated conservatively in the past for the above stated problem and despite conservative measures, they continue to have progressive pain and severe functional limitations and dysfunction. They have failed non-operative management including home exercise, medications, and injections. It is felt that they would benefit from undergoing total joint replacement. Risks and benefits of the procedure have been discussed with the patient and they elect to proceed with surgery. There are no active contraindications to surgery such as ongoing infection or rapidly progressive neurological disease.   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 Mother. mother Cerebrovascular Accident. father Hypertension. mother and father Chronic Obstructive Lung Disease.  mother Diabetes Mellitus. father Heart Disease. father   Social History Tobacco use. Former smoker. former smoker; smoke(d) 3 or more pack(s) per day Tobacco / smoke exposure. yes outdoors only Previously in rehab. no Current work status. working full time Aeronautical engineer. yes Number of flights of stairs before winded. 4-5 Most recent primary occupation. Music therapist Marital status. married Illicit drug use. no Exercise. Exercises daily; does running / walking Living situation. live with spouse Children. 3 Alcohol use. current drinker; drinks hard liquor; only occasionally per week Drug/Alcohol Rehab (Currently). no Post-Surgical Plans. Home    Medication History OxyCODONE HCl (5MG  Tablet, Oral) Active. Allopurinol ( Oral) Specific dose unknown - Active.    Past Surgical History Kidney Removal. left Colon Polyp Removal - Colonoscopy Tonsillectomy Total Hip Replacement. right Arthroscopy of Shoulder. bilateral Rotator Cuff Repair. bilateral   Medical History Other disease, cancer, significant illness. Kidney cancer Gout    Review of Systems General:Not Present- Chills, Fever, Night Sweats, Fatigue, Weight Gain, Weight Loss and Memory Loss. Skin:Not Present- Hives, Itching, Rash, Eczema and Lesions. HEENT:Not Present- Tinnitus, Headache, Double Vision, Visual Loss, Hearing Loss and Dentures. Respiratory:Present- Cough (recent URI). Not Present- Shortness of breath with exertion, Shortness of breath at rest, Allergies, Coughing up blood and Chronic Cough. Cardiovascular:Not Present- Chest Pain, Racing/skipping heartbeats, Difficulty Breathing Lying Down, Murmur, Swelling and Palpitations. Gastrointestinal:Not Present- Bloody Stool, Heartburn, Abdominal Pain, Vomiting, Nausea, Constipation, Diarrhea, Difficulty Swallowing, Jaundice and Loss of appetitie. Male Genitourinary:Not Present- Urinary frequency, Blood in Urine,  Weak urinary stream, Discharge, Flank Pain, Incontinence, Painful Urination, Urgency, Urinary Retention and Urinating at Night. Musculoskeletal:Present- Joint Pain. Not Present- Muscle Weakness, Muscle Pain, Joint Swelling, Back  Pain, Morning Stiffness and Spasms. Neurological:Not Present- Tremor, Dizziness, Blackout spells, Paralysis, Difficulty with balance and Weakness. Psychiatric:Not Present- Insomnia.    Vitals Pulse: 76 (Regular) Resp.: 16 (Unlabored) BP: 112/68 (Sitting, Right Arm, Standard)   Physical Exam The physical exam findings are as follows:   General Mental Status - Alert, cooperative and good historian. General Appearance- pleasant. Not in acute distress. Orientation- Oriented X3. Build & Nutrition- Well nourished and Well developed.   Head and Neck Head- normocephalic, atraumatic . Neck Global Assessment- supple. no bruit auscultated on the right and no bruit auscultated on the left. upper denture work  Eye Pupil- Bilateral- Regular and Round. Motion- Bilateral- EOMI.   Chest and Lung Exam Auscultation: Breath sounds:- clear at anterior chest wall and - clear at posterior chest wall. Adventitious sounds:- No Adventitious sounds.   Cardiovascular Auscultation:Rhythm- Regular rate and rhythm. Heart Sounds- S1 WNL and S2 WNL. Murmurs & Other Heart Sounds:Auscultation of the heart reveals - No Murmurs.   Abdomen Inspection:Contour- Generalized mild distention. Palpation/Percussion:Tenderness- Abdomen is non-tender to palpation. Rigidity (guarding)- Abdomen is soft. Auscultation:Auscultation of the abdomen reveals - Bowel sounds normal.   Male Genitourinary Not done, not pertinent to present illness  Musculoskeletal On exam, he's in no distress. Evaluation of his left hip shows flexion about 100, rotation in 20, out 30, abduction 30 but with pain on any attempted motion.  RADIOGRAPHS: His plain  radiographs from last visit showed definite narrowing of the left hip joint but not completely bone on bone. His MRI shows a significant stress reaction of the femoral head with bone on bone change in that hip and an effusion.   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 Lequita Halt.  Plan is to go home.  PCP - Dr. Clydie Braun - Patient has been seen preoperatively and felt to be stable for surgery.  The patient will not receive TXA (tranexamic acid) due to: Renal Cancer  Signed electronically by Lauraine Rinne, III PA-C

## 2013-03-06 NOTE — Anesthesia Procedure Notes (Addendum)
Spinal  Patient location during procedure: OR Start time: 03/06/2013 2:32 PM End time: 03/06/2013 2:40 PM Staffing Anesthesiologist: ROSE, Greggory Stallion CRNA/Resident: Paris Lore Performed by: anesthesiologist and resident/CRNA  Preanesthetic Checklist Completed: patient identified, site marked, surgical consent, pre-op evaluation, timeout performed, IV checked, risks and benefits discussed and monitors and equipment checked Spinal Block Patient position: sitting Prep: Betadine Patient monitoring: heart rate, continuous pulse ox and blood pressure Approach: midline Location: L2-3 Injection technique: single-shot Needle Needle type: Sprotte  Needle gauge: 24 G Needle length: 9 cm Assessment Sensory level: T4 Additional Notes Expiration date of kit checked and confirmed. Patient tolerated procedure well, without complications. Spinal X 2 attempts first per Arie Sabina without success, MD Rose X 1 midline 24G with clear CSF return and easy administration of medication. Noted T4 level on exam.

## 2013-03-06 NOTE — Transfer of Care (Signed)
Immediate Anesthesia Transfer of Care Note  Patient: Paul Byrd  Procedure(s) Performed: Procedure(s): LEFT TOTAL HIP ARTHROPLASTY ANTERIOR APPROACH (Left)  Patient Location: PACU  Anesthesia Type:Spinal  Level of Consciousness: awake, alert  and oriented  Airway & Oxygen Therapy: Patient Spontanous Breathing and Patient connected to face mask oxygen  Post-op Assessment: Report given to PACU RN and Post -op Vital signs reviewed and stable  Post vital signs: Reviewed and stable  Complications: No apparent anesthesia complications

## 2013-03-07 LAB — CBC
HCT: 36.1 % — ABNORMAL LOW (ref 39.0–52.0)
Hemoglobin: 11.9 g/dL — ABNORMAL LOW (ref 13.0–17.0)
MCH: 27.9 pg (ref 26.0–34.0)
MCHC: 33 g/dL (ref 30.0–36.0)
Platelets: 173 10*3/uL (ref 150–400)
RBC: 4.26 MIL/uL (ref 4.22–5.81)

## 2013-03-07 LAB — BASIC METABOLIC PANEL
Calcium: 8.5 mg/dL (ref 8.4–10.5)
Creatinine, Ser: 1.21 mg/dL (ref 0.50–1.35)
GFR calc non Af Amer: 63 mL/min — ABNORMAL LOW (ref >90)
Glucose, Bld: 117 mg/dL — ABNORMAL HIGH (ref 70–99)
Sodium: 136 mEq/L (ref 135–145)

## 2013-03-07 MED ORDER — RIVAROXABAN 10 MG PO TABS: 10.0000 mg | ORAL_TABLET | Freq: Every day | ORAL | Status: DC

## 2013-03-07 MED ORDER — TRAMADOL HCL 50 MG PO TABS: 50.0000 mg | ORAL_TABLET | Freq: Four times a day (QID) | ORAL | Status: DC | PRN

## 2013-03-07 MED ORDER — METHOCARBAMOL 500 MG PO TABS: 500.0000 mg | ORAL_TABLET | Freq: Four times a day (QID) | ORAL | Status: DC | PRN

## 2013-03-07 MED ORDER — OXYCODONE HCL 5 MG PO TABS: 5.0000 mg | ORAL_TABLET | ORAL | Status: DC | PRN

## 2013-03-07 NOTE — Progress Notes (Signed)
Physical Therapy Treatment Patient Details Name: Paul Byrd MRN: [id number] DOB: January 19, 1951 Today's Date: 03/07/2013 Time: 1340-1401 PT Time Calculation (min): 21 min  PT Assessment / Plan / Recommendation  History of Present Illness pt was admitted for L DA THA   PT Comments     Follow Up Recommendations  Home health PT     Does the patient have the potential to tolerate intense rehabilitation     Barriers to Discharge        Equipment Recommendations  None recommended by PT    Recommendations for Other Services OT consult  Frequency 7X/week   Progress towards PT Goals Progress towards PT goals: Progressing toward goals  Plan Current plan remains appropriate    Precautions / Restrictions Precautions Precautions: Fall Restrictions Weight Bearing Restrictions: No Other Position/Activity Restrictions: WBAT   Pertinent Vitals/Pain 4-5/10; pt declines meds, ice pack provided    Mobility  Transfers Transfers: Sit to Stand;Stand to Sit Sit to Stand: 4: Min guard Stand to Sit: 4: Min guard Details for Transfer Assistance: cues for LE managment and use of UEs Ambulation/Gait Ambulation/Gait Assistance: 4: Min guard Ambulation Distance (Feet): 165 Feet (twice) Assistive device: Rolling walker Ambulation/Gait Assistance Details: cues for initial sequence, stride length, posture and position from RW Gait Pattern: Step-to pattern;Step-through pattern;Decreased step length - left;Shuffle;Antalgic Stairs: No    Exercises     PT Diagnosis:    PT Problem List:   PT Treatment Interventions:     PT Goals (current goals can now be found in the care plan section) Acute Rehab PT Goals Patient Stated Goal: Resume previous lifestyle with decreased pain PT Goal Formulation: With patient Time For Goal Achievement: 03/14/13 Potential to Achieve Goals: Good  Visit Information  Last PT Received On: 03/07/13 Assistance Needed: +1 History of Present Illness: pt was admitted  for L DA THA    Subjective Data  Subjective: Its nice to have my legs the same length again Patient Stated Goal: Resume previous lifestyle with decreased pain   Cognition  Cognition Arousal/Alertness: Awake/alert Behavior During Therapy: WFL for tasks assessed/performed Overall Cognitive Status: Within Functional Limits for tasks assessed    Balance     End of Session PT - End of Session Equipment Utilized During Treatment: Gait belt Activity Tolerance: Patient tolerated treatment well Patient left: in chair;with call bell/phone within reach;with family/visitor present Nurse Communication: Mobility status   GP     Paul Byrd 03/07/2013, 2:08 PM

## 2013-03-07 NOTE — Evaluation (Signed)
Occupational Therapy Evaluation Patient Details Name: Paul Byrd MRN: [id number] DOB: [birth year] Today's Date: 03/07/2013 Time: 4098-1191 OT Time Calculation (min): 14 min  OT Assessment / Plan / Recommendation History of present illness pt was admitted for L DA THA   Clinical Impression   Pt was admitted for the above surgery.  All education was completed.  Pt does not need any further OT at this time.      OT Assessment  Patient does not need any further OT services    Follow Up Recommendations  No OT follow up    Barriers to Discharge      Equipment Recommendations   (family will get 3:1 themselves)    Recommendations for Other Services    Frequency       Precautions / Restrictions Precautions Precautions: Fall Restrictions Weight Bearing Restrictions: No   Pertinent Vitals/Pain 4/10 initially, L hip.  5-6/10 after OT.  RN brought pain meds and pt was repositioned with ice applied     ADL  Grooming: Set up Where Assessed - Grooming: Unsupported sitting Upper Body Bathing: Set up Where Assessed - Upper Body Bathing: Unsupported sitting Lower Body Bathing: Moderate assistance Where Assessed - Lower Body Bathing: Supported sit to stand Upper Body Dressing: Set up Where Assessed - Upper Body Dressing: Unsupported sitting Lower Body Dressing: Maximal assistance Where Assessed - Lower Body Dressing: Supported sit to stand Toilet Transfer: Hydrographic surveyor Method: Sit to Barista: Materials engineer and Hygiene: Min guard Where Assessed - Engineer, mining and Hygiene: Sit to stand from 3-in-1 or toilet Tub/Shower Transfer: Min guard Tub/Shower Transfer Method: Science writer: Walk in Scientist, research (physical sciences) Used: Rolling walker Transfers/Ambulation Related to ADLs: ambulated to bathroom.  cues for safety to sidestep through tight spaces and cues to extend LLE when  sitting for comfort.  Pt could have used a little assistance to lift LLE out of shower stall, but he managed himself before I could assist.   ADL Comments: Pt can only reach to L knee comfortably:  had family assist with adls at last surgery.  Does not want AE.  Pt reports family will get 3:1 themselves; he has a toilet riser but would prefer wide 3:1.      OT Diagnosis:    OT Problem List:   OT Treatment Interventions:     OT Goals(Current goals can be found in the care plan section)    Visit Information  Last OT Received On: 03/07/13 Assistance Needed: +1 History of Present Illness: pt was admitted for L DA THA       Prior Functioning     Home Living Family/patient expects to be discharged to:: Private residence Living Arrangements: Spouse/significant other Home Equipment: Toilet riser Additional Comments: family plans to get pt a wide 3:1 commode:  they will get this themselves per pt Communication Communication: No difficulties         Vision/Perception     Cognition  Cognition Arousal/Alertness: Awake/alert Behavior During Therapy: WFL for tasks assessed/performed Overall Cognitive Status: Within Functional Limits for tasks assessed    Extremity/Trunk Assessment Upper Extremity Assessment Upper Extremity Assessment: Overall WFL for tasks assessed     Mobility Transfers Transfers: Sit to Stand;Stand to Sit Sit to Stand: From chair/3-in-1;With armrests;4: Min guard Stand to Sit: 4: Min assist;To chair/3-in-1 Details for Transfer Assistance: decreased control of descent:  assisted LLE     Exercise     Balance  End of Session OT - End of Session Activity Tolerance: Patient tolerated treatment well Patient left: in chair;with call bell/phone within reach;with family/visitor present  GO     [name] 03/07/2013, 9:58 AM Marica Otter, OTR/L [phone] 03/07/2013

## 2013-03-07 NOTE — Evaluation (Signed)
Physical Therapy Evaluation Patient Details Name: Paul Byrd MRN: [id number] DOB: 1951-03-09 Today's Date: 03/07/2013 Time: 4098-1191 PT Time Calculation (min): 30 min  PT Assessment / Plan / Recommendation History of Present Illness  pt was admitted for L DA THA  Clinical Impression  Pt s/p L THR presents with decreased L LE strength/ROM and post op pain limiting functional mobility.  Pt should progress to d/c home with family assist and HHPT follow up.    PT Assessment  Patient needs continued PT services    Follow Up Recommendations  Home health PT    Does the patient have the potential to tolerate intense rehabilitation      Barriers to Discharge        Equipment Recommendations  None recommended by PT    Recommendations for Other Services OT consult   Frequency 7X/week    Precautions / Restrictions Precautions Precautions: Fall Restrictions Weight Bearing Restrictions: No Other Position/Activity Restrictions: WBAT   Pertinent Vitals/Pain 5-6/10; premed with tylenol only; RN aware, ice pack provided      Mobility  Bed Mobility Bed Mobility: Supine to Sit Supine to Sit: 4: Min assist;3: Mod assist Details for Bed Mobility Assistance: cues for sequence and use of UEs to self assist Transfers Transfers: Sit to Stand;Stand to Sit Sit to Stand: From chair/3-in-1;With armrests;4: Min guard Stand to Sit: 4: Min assist;To chair/3-in-1 Details for Transfer Assistance: decreased control of descent:  assisted LLE Ambulation/Gait Ambulation/Gait Assistance: 4: Min assist Ambulation Distance (Feet): 75 Feet (and 20' x 2) Assistive device: Rolling walker Ambulation/Gait Assistance Details: cues for sequence, posture, stride length and position from RW Gait Pattern: Step-to pattern Stairs: No    Exercises Total Joint Exercises Ankle Circles/Pumps: AROM;10 reps;Supine;Both Quad Sets: AROM;Both;10 reps;Supine Heel Slides: AAROM;15 reps;Supine;Left Hip  ABduction/ADduction: AAROM;Left;15 reps;Supine   PT Diagnosis: Difficulty walking  PT Problem List: Decreased strength;Decreased range of motion;Decreased activity tolerance;Decreased mobility;Decreased knowledge of use of DME;Pain;Decreased knowledge of precautions PT Treatment Interventions: DME instruction;Gait training;Stair training;Functional mobility training;Therapeutic activities;Therapeutic exercise;Patient/family education     PT Goals(Current goals can be found in the care plan section) Acute Rehab PT Goals Patient Stated Goal: Resume previous lifestyle with decreased pain PT Goal Formulation: With patient Time For Goal Achievement: 03/14/13 Potential to Achieve Goals: Good  Visit Information  Last PT Received On: 03/07/13 Assistance Needed: +1 History of Present Illness: pt was admitted for L DA THA       Prior Functioning  Home Living Family/patient expects to be discharged to:: Private residence Living Arrangements: Spouse/significant other Available Help at Discharge: Family Type of Home: House Home Access: Stairs to enter Secretary/administrator of Steps: 1 Entrance Stairs-Rails: None Home Layout: One level Home Equipment: Toilet riser Additional Comments: family plans to get pt a wide 3:1 commode:  they will get this themselves per pt Prior Function Level of Independence: Independent Communication Communication: No difficulties    Cognition  Cognition Arousal/Alertness: Awake/alert Behavior During Therapy: WFL for tasks assessed/performed Overall Cognitive Status: Within Functional Limits for tasks assessed    Extremity/Trunk Assessment Upper Extremity Assessment Upper Extremity Assessment: Overall WFL for tasks assessed Lower Extremity Assessment Lower Extremity Assessment: RLE deficits/detail;LLE deficits/detail RLE Deficits / Details: Hip flex lltd to ~ 90 "since my 2 hip surgeries" LLE Deficits / Details: Hip strength 2/5 with AAROM at hip to 80  flex and 15 abd   Balance    End of Session PT - End of Session Equipment Utilized During Treatment: Gait belt Activity  Tolerance: Patient tolerated treatment well Patient left: in chair;with call bell/phone within reach;with family/visitor present Nurse Communication: Mobility status  GP     Ashwini Jago 03/07/2013, 11:08 AM

## 2013-03-07 NOTE — Discharge Instructions (Signed)
°Dr. Casie Sturgeon °Total Joint Specialist °[facility] °[address] °[place], [place] [address] °[phone] ° ° ° °ANTERIOR APPROACH TOTAL HIP REPLACEMENT POSTOPERATIVE DIRECTIONS ° ° °Hip Rehabilitation, Guidelines Following Surgery  °The results of a hip operation are greatly improved after range of motion and muscle strengthening exercises. Follow all safety measures which are given to protect your hip. If any of these exercises cause increased pain or swelling in your joint, decrease the amount until you are comfortable again. Then slowly increase the exercises. Call your caregiver if you have problems or questions.  °HOME CARE INSTRUCTIONS  °Most of the following instructions are designed to prevent the dislocation of your new hip.  °Remove items at home which could result in a fall. This includes throw rugs or furniture in walking pathways.  °Continue medications as instructed at time of discharge. °· You may have some home medications which will be placed on hold until you complete the course of blood thinner medication. °· You may start showering once you are discharged home but do not submerge the incision under water. Just pat the incision dry and apply a dry gauze dressing on daily. °Do not put on socks or shoes without following the instructions of your caregivers.  °Sit on high chairs which makes it easier to stand.  °Sit on chairs with arms. Use the chair arms to help push yourself up when arising.  °Keep your leg on the side of the operation out in front of you when standing up.  °Arrange for the use of a toilet seat elevator so you are not sitting low.   °· Walk with walker as instructed.  °You may resume a sexual relationship in one month or when given the OK by your caregiver.  °Use walker as long as suggested by your caregivers.  °You may put full weight on your legs and walk as much as is comfortable. °Avoid periods of inactivity such as sitting longer than an hour  when not asleep. This helps prevent blood clots.  °You may return to work once you are cleared by your surgeon.  °Do not drive a car for 6 weeks or until released by your surgeon.  °Do not drive while taking narcotics.  °Wear elastic stockings for three weeks following surgery during the day but you may remove then at night.  °Make sure you keep all of your appointments after your operation with all of your doctors and caregivers. You should call the office at the above phone number and make an appointment for approximately two weeks after the date of your surgery. °Change the dressing daily and reapply a dry dressing each time. °Please pick up a stool softener and laxative for home use as long as you are requiring pain medications. °· Continue to use ice on the hip for pain and swelling from surgery. You may notice swelling that will progress down to the foot and ankle.  This is normal after  surgery.  Elevate the leg when you are not up walking on it.   °It is important for you to complete the blood thinner medication as prescribed by your doctor. °· Continue to use the breathing machine which will help keep your temperature down.  It is common for your temperature to cycle up and down following surgery, especially at night when you are not up moving around and exerting yourself.  The breathing machine keeps your lungs expanded and your temperature down. ° °RANGE OF MOTION AND STRENGTHENING EXERCISES  °  These exercises are designed to help you keep full movement of your hip joint. Follow your caregiver's or physical therapist's instructions. Perform all exercises about fifteen times, three times per day or as directed. Exercise both hips, even if you have had only one joint replacement. These exercises can be done on a training (exercise) mat, on the floor, on a table or on a bed. Use whatever works the best and is most comfortable for you. Use music or television while you are exercising so that the exercises are  a pleasant break in your day. This will make your life better with the exercises acting as a break in routine you can look forward to.  °Lying on your back, slowly slide your foot toward your buttocks, raising your knee up off the floor. Then slowly slide your foot back down until your leg is straight again.  °Lying on your back spread your legs as far apart as you can without causing discomfort.  °Lying on your side, raise your upper leg and foot straight up from the floor as far as is comfortable. Slowly lower the leg and repeat.  °Lying on your back, tighten up the muscle in the front of your thigh (quadriceps muscles). You can do this by keeping your leg straight and trying to raise your heel off the floor. This helps strengthen the largest muscle supporting your knee.  °Lying on your back, tighten up the muscles of your buttocks both with the legs straight and with the knee bent at a comfortable angle while keeping your heel on the floor.  ° °SKILLED REHAB INSTRUCTIONS: °If the patient is transferred to a skilled rehab facility following release from the hospital, a list of the current medications will be sent to the facility for the patient to continue.  When discharged from the skilled rehab facility, please have the facility set up the patient's Home Health Physical Therapy prior to being released. Also, the skilled facility will be responsible for providing the patient with their medications at time of release from the facility to include their pain medication, the muscle relaxants, and their blood thinner medication. If the patient is still at the rehab facility at time of the two week follow up appointment, the skilled rehab facility will also need to assist the patient in arranging follow up appointment in our office and any transportation needs. ° °MAKE SURE YOU:  °Understand these instructions.  °Will watch your condition.  °Will get help right away if you are not doing well or get worse. ° °Pick up  stool softner and laxative for home. °Do not submerge incision under water. °May shower. °Continue to use ice for pain and swelling from surgery. °Total Hip Protocol. ° ° °

## 2013-03-07 NOTE — Progress Notes (Signed)
   Subjective: 1 Day Post-Op Procedure(s) (LRB): LEFT TOTAL HIP ARTHROPLASTY ANTERIOR APPROACH (Left) Patient reports pain as mild.   We will start therapy today.  Plan is to go Home after hospital stay.  Objective: Vital signs in last 24 hours: Temp:  [97.4 F (36.3 C)-98.7 F (37.1 C)] 97.9 F (36.6 C) (11/27 0611) Pulse Rate:  [55-69] 66 (11/27 0611) Resp:  [16-22] 16 (11/27 0611) BP: (92-143)/(53-79) 106/67 mmHg (11/27 0611) SpO2:  [95 %-100 %] 96 % (11/27 0611) Weight:  [260 lb (117.935 kg)] 260 lb (117.935 kg) (11/26 1830)  Intake/Output from previous day:  Intake/Output Summary (Last 24 hours) at 03/07/13 0847 Last data filed at 03/07/13 0600  Gross per 24 hour  Intake 5713.33 ml  Output   2240 ml  Net 3473.33 ml    Intake/Output this shift:    Labs:  Recent Labs  03/07/13 0353  HGB 11.9*    Recent Labs  03/07/13 0353  WBC 12.6*  RBC 4.26  HCT 36.1*  PLT 173    Recent Labs  03/07/13 0353  NA 136  K 4.9  CL 101  CO2 27  BUN 14  CREATININE 1.21  GLUCOSE 117*  CALCIUM 8.5   No results found for this basename: LABPT, INR,  in the last 72 hours  EXAM General - Patient is Alert, Appropriate and Oriented Extremity - Neurologically intact Neurovascular intact No cellulitis present Compartment soft Dressing - dressing C/D/I Motor Function - intact, moving foot and toes well on exam.  Hemovac pulled without difficulty.  Past Medical History  Diagnosis Date  . Chronic kidney disease     nephrectomy left kidney-cancer  . Arthritis   . 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 Headache(784.0)     Assessment/Plan: 1 Day Post-Op Procedure(s) (LRB): LEFT TOTAL HIP ARTHROPLASTY ANTERIOR APPROACH (Left) Principal Problem:   OA (osteoarthritis) of hip   Advance diet Up with therapy D/C IV fluids Plan for discharge tomorrow  DVT Prophylaxis - Xarelto Weight Bearing As Tolerated left Leg Hemovac Pulled Begin Therapy   Paul Byrd

## 2013-03-08 ENCOUNTER — Encounter (HOSPITAL_COMMUNITY): Payer: Self-pay | Admitting: Orthopedic Surgery

## 2013-03-08 LAB — BASIC METABOLIC PANEL
BUN: 13 mg/dL (ref 6–23)
CO2: 25 mEq/L (ref 19–32)
GFR calc Af Amer: 85 mL/min — ABNORMAL LOW (ref >90)
GFR calc non Af Amer: 74 mL/min — ABNORMAL LOW (ref >90)
Glucose, Bld: 138 mg/dL — ABNORMAL HIGH (ref 70–99)
Potassium: 4 mEq/L (ref 3.5–5.1)

## 2013-03-08 LAB — CBC
HCT: 36.8 % — ABNORMAL LOW (ref 39.0–52.0)
Hemoglobin: 12.3 g/dL — ABNORMAL LOW (ref 13.0–17.0)
MCHC: 33.4 g/dL (ref 30.0–36.0)
MCV: 85.4 fL (ref 78.0–100.0)
RDW: 14.8 % (ref 11.5–15.5)

## 2013-03-08 NOTE — Progress Notes (Signed)
   Subjective: 2 Days Post-Op Procedure(s) (LRB): LEFT TOTAL HIP ARTHROPLASTY ANTERIOR APPROACH (Left) Patient reports pain as mild.   Plan is to go Home after hospital stay.  Objective: Vital signs in last 24 hours: Temp:  [98.1 F (36.7 C)-99 F (37.2 C)] 98.1 F (36.7 C) (11/28 0443) Pulse Rate:  [60-85] 60 (11/28 0443) Resp:  [16-18] 16 (11/28 0745) BP: (114-124)/(65-76) 124/76 mmHg (11/28 0443) SpO2:  [94 %-96 %] 95 % (11/28 0443)  Intake/Output from previous day:  Intake/Output Summary (Last 24 hours) at 03/08/13 0822 Last data filed at 03/07/13 2234  Gross per 24 hour  Intake   1985 ml  Output   1600 ml  Net    385 ml    Intake/Output this shift:    Labs:  Recent Labs  03/07/13 0353 03/08/13 0404  HGB 11.9* 12.3*    Recent Labs  03/07/13 0353 03/08/13 0404  WBC 12.6* 15.8*  RBC 4.26 4.31  HCT 36.1* 36.8*  PLT 173 188    Recent Labs  03/07/13 0353 03/08/13 0404  NA 136 139  K 4.9 4.0  CL 101 104  CO2 27 25  BUN 14 13  CREATININE 1.21 1.07  GLUCOSE 117* 138*  CALCIUM 8.5 9.1   No results found for this basename: LABPT, INR,  in the last 72 hours  EXAM General - Patient is Alert, Appropriate and Oriented Extremity - Neurologically intact Neurovascular intact Incision: dressing C/D/I No cellulitis present Compartment soft Dressing/Incision - clean, dry, no drainage Motor Function - intact, moving foot and toes well on exam.   Past Medical History  Diagnosis Date  . Chronic kidney disease     nephrectomy left kidney-cancer  . Arthritis   . 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 Headache(784.0)     Assessment/Plan: 2 Days Post-Op Procedure(s) (LRB): LEFT TOTAL HIP ARTHROPLASTY ANTERIOR APPROACH (Left) Principal Problem:   OA (osteoarthritis) of hip   D/C IV fluids Discharge home with home health  DVT Prophylaxis - Xarelto Weight Bearing As Tolerated left Leg  Winna Golla V 03/08/2013, 8:22 AM

## 2013-03-08 NOTE — Care Management Note (Signed)
    Page 1 of 1   03/08/2013     11:10:12 AM   CARE MANAGEMENT NOTE 03/08/2013  Patient:  Paul Byrd, Paul Byrd   Account Number:  [id number]  Date Initiated:  03/08/2013  Documentation initiated by:  Lorenda Ishihara  Subjective/Objective Assessment:   62 yo male admitted s/p Left total hip arthroplasty, anterior approach. PTA lived at home with spouse.     Action/Plan:   Home when stable   Anticipated DC Date:  03/08/2013   Anticipated DC Plan:  HOME W HOME HEALTH SERVICES      DC Planning Services  CM consult      [facility] Choice  HOME HEALTH   Choice offered to / List presented to:  C-1 Patient        HH arranged  HH-2 PT      [facility] agency  [facility]   Status of service:  Completed, signed off Medicare Important Message given?   (If response is "NO", the following Medicare IM given date fields will be blank) Date Medicare IM given:   Date Additional Medicare IM given:    Discharge Disposition:  HOME W HOME HEALTH SERVICES  Per UR Regulation:  Reviewed for med. necessity/level of care/duration of stay  If discussed at Long Length of Stay Meetings, dates discussed:    Comments:

## 2013-03-08 NOTE — Progress Notes (Signed)
Physical Therapy Treatment Patient Details Name: CARLYN MULLENBACH MRN: [id number] DOB: 1951-01-18 Today's Date: 03/08/2013 Time: 5784-6962 PT Time Calculation (min): 36 min  PT Assessment / Plan / Recommendation  History of Present Illness pt was admitted for L DA THA   PT Comments   Reviewed car transfers and stairs with pt  Follow Up Recommendations  Home health PT     Does the patient have the potential to tolerate intense rehabilitation     Barriers to Discharge        Equipment Recommendations  None recommended by PT    Recommendations for Other Services OT consult  Frequency 7X/week   Progress towards PT Goals Progress towards PT goals: Progressing toward goals  Plan Current plan remains appropriate    Precautions / Restrictions Precautions Precautions: Fall Restrictions Weight Bearing Restrictions: No Other Position/Activity Restrictions: WBAT   Pertinent Vitals/Pain 3/10    Mobility  Bed Mobility Bed Mobility: Supine to Sit;Sit to Supine Supine to Sit: 4: Min guard;5: Supervision Sit to Supine: 4: Min guard;5: Supervision Details for Bed Mobility Assistance: cues for sequence; pt self assisting L LE with R LE Transfers Transfers: Sit to Stand;Stand to Sit Sit to Stand: 5: Supervision Stand to Sit: 5: Supervision Details for Transfer Assistance: cues for LE managment and use of UEs Ambulation/Gait Ambulation/Gait Assistance: 4: Min guard;5: Supervision Ambulation Distance (Feet): 200 Feet Assistive device: Rolling walker Ambulation/Gait Assistance Details: cues for posture, position from RW, equalization of stride with progression to recip, and increased knee flex on L Gait Pattern: Step-to pattern;Step-through pattern;Decreased step length - left;Shuffle;Antalgic Stairs: Yes Stairs Assistance: 4: Min guard Stair Management Technique: No rails;Forwards;With walker;Step to pattern Number of Stairs: 1    Exercises Total Joint Exercises Ankle  Circles/Pumps: AROM;Supine;Both;15 reps Quad Sets: AROM;Both;10 reps;Supine Gluteal Sets: AROM;Both;10 reps;Supine Heel Slides: AAROM;Supine;Left;20 reps Hip ABduction/ADduction: AAROM;Left;Supine;20 reps   PT Diagnosis:    PT Problem List:   PT Treatment Interventions:     PT Goals (current goals can now be found in the care plan section) Acute Rehab PT Goals Patient Stated Goal: Resume previous lifestyle with decreased pain PT Goal Formulation: With patient Time For Goal Achievement: 03/14/13 Potential to Achieve Goals: Good  Visit Information  Last PT Received On: 03/08/13 Assistance Needed: +1 History of Present Illness: pt was admitted for L DA THA    Subjective Data  Patient Stated Goal: Resume previous lifestyle with decreased pain   Cognition  Cognition Arousal/Alertness: Awake/alert Behavior During Therapy: WFL for tasks assessed/performed Overall Cognitive Status: Within Functional Limits for tasks assessed    Balance     End of Session PT - End of Session Equipment Utilized During Treatment: Gait belt Activity Tolerance: Patient tolerated treatment well Patient left: in chair;with call bell/phone within reach;with family/visitor present Nurse Communication: Mobility status   GP     Boyce Keltner 03/08/2013, 12:02 PM

## 2013-03-11 NOTE — Anesthesia Postprocedure Evaluation (Signed)
  Anesthesia Post-op Note  Patient: Paul Byrd  Procedure(s) Performed: Procedure(s) (LRB): LEFT TOTAL HIP ARTHROPLASTY ANTERIOR APPROACH (Left)  Patient Location: PACU  Anesthesia Type: Spinal  Level of Consciousness: awake and alert   Airway and Oxygen Therapy: Patient Spontanous Breathing  Post-op Pain: mild  Post-op Assessment: Post-op Vital signs reviewed, Patient's Cardiovascular Status Stable, Respiratory Function Stable, Patent Airway and No signs of Nausea or vomiting  Last Vitals:  Filed Vitals:   03/08/13 0745  BP:   Pulse:   Temp:   Resp: 16    Post-op Vital Signs: stable   Complications: No apparent anesthesia complications

## 2013-03-14 NOTE — Discharge Summary (Signed)
Physician Discharge Summary   Patient ID: Paul Byrd MRN: [id number] DOB/AGE: Apr 23, 1950 63 y.o.  Admit date: 03/06/2013 Discharge date: 03/08/2013  Primary Diagnosis:  Osteoarthritis of the Left hip.  Admission Diagnoses:  Past Medical History  Diagnosis Date  . Chronic kidney disease     nephrectomy left kidney-cancer  . Arthritis   . 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 [id number](478.2)    Discharge Diagnoses:   Principal Problem:   OA (osteoarthritis) of hip  Estimated body mass index is 35.25 kg/(m^2) as calculated from the following:   Height as of this encounter: 6' (1.829 m).   Weight as of this encounter: 117.935 kg (260 lb).  Procedure(s) (LRB): LEFT TOTAL HIP ARTHROPLASTY ANTERIOR APPROACH (Left)   Consults: None  HPI: Paul Byrd is a 62 y.o. male who has advanced end-  stage arthritis of his Left hip with progressively worsening pain and  dysfunction.The patient has failed nonoperative management and presents for  total hip arthroplasty.   Laboratory Data: Admission on 03/06/2013, Discharged on 03/08/2013  Component Date Value Range Status  . ABO/RH(D) 03/06/2013 O POS   Final  . Antibody Screen 03/06/2013 NEG   Final  . Sample Expiration 03/06/2013 03/09/2013   Final  . ABO/RH(D) 03/06/2013 O POS   Final  . WBC 03/07/2013 12.6* 4.0 - 10.5 K/uL Final  . RBC 03/07/2013 4.26  4.22 - 5.81 MIL/uL Final  . Hemoglobin 03/07/2013 11.9* 13.0 - 17.0 g/dL Final  . HCT 95/62/1308 36.1* 39.0 - 52.0 % Final  . MCV 03/07/2013 84.7  78.0 - 100.0 fL Final  . MCH 03/07/2013 27.9  26.0 - 34.0 pg Final  . MCHC 03/07/2013 33.0  30.0 - 36.0 g/dL Final  . RDW 65/78/4696 14.8  11.5 - 15.5 % Final  . Platelets 03/07/2013 173  150 - 400 K/uL Final  . Sodium 03/07/2013 136  135 - 145 mEq/L Final  . Potassium 03/07/2013 4.9  3.5 - 5.1 mEq/L Final  . Chloride 03/07/2013 101  96 - 112 mEq/L Final  . CO2 03/07/2013 27  19 - 32 mEq/L Final  . Glucose, Bld 03/07/2013 117*  70 - 99 mg/dL Final  . BUN 29/52/8413 14  6 - 23 mg/dL Final  . Creatinine, Ser 03/07/2013 1.21  0.50 - 1.35 mg/dL Final  . Calcium 24/40/1027 8.5  8.4 - 10.5 mg/dL Final  . GFR calc non Af Amer 03/07/2013 63* >90 mL/min Final  . GFR calc Af Amer 03/07/2013 73* >90 mL/min Final   Comment: (NOTE)                          The eGFR has been calculated using the CKD EPI equation.                          This calculation has not been validated in all clinical situations.                          eGFR's persistently <90 mL/min signify possible Chronic Kidney                          Disease.  . WBC 03/08/2013 15.8* 4.0 - 10.5 K/uL Final  . RBC 03/08/2013 4.31  4.22 - 5.81 MIL/uL Final  . Hemoglobin 03/08/2013 12.3* 13.0 - 17.0 g/dL Final  . HCT  03/08/2013 36.8* 39.0 - 52.0 % Final  . MCV 03/08/2013 85.4  78.0 - 100.0 fL Final  . MCH 03/08/2013 28.5  26.0 - 34.0 pg Final  . MCHC 03/08/2013 33.4  30.0 - 36.0 g/dL Final  . RDW 78/29/5621 14.8  11.5 - 15.5 % Final  . Platelets 03/08/2013 188  150 - 400 K/uL Final  . Sodium 03/08/2013 139  135 - 145 mEq/L Final  . Potassium 03/08/2013 4.0  3.5 - 5.1 mEq/L Final   Comment: REPEATED TO VERIFY                          DELTA CHECK NOTED  . Chloride 03/08/2013 104  96 - 112 mEq/L Final  . CO2 03/08/2013 25  19 - 32 mEq/L Final  . Glucose, Bld 03/08/2013 138* 70 - 99 mg/dL Final  . BUN 30/86/5784 13  6 - 23 mg/dL Final  . Creatinine, Ser 03/08/2013 1.07  0.50 - 1.35 mg/dL Final  . Calcium 69/62/9528 9.1  8.4 - 10.5 mg/dL Final  . GFR calc non Af Amer 03/08/2013 74* >90 mL/min Final  . GFR calc Af Amer 03/08/2013 85* >90 mL/min Final   Comment: (NOTE)                          The eGFR has been calculated using the CKD EPI equation.                          This calculation has not been validated in all clinical situations.                          eGFR's persistently <90 mL/min signify possible Chronic Kidney                          Disease.    Hospital Outpatient Visit on 03/01/2013  Component Date Value Range Status  . aPTT 03/01/2013 31  24 - 37 seconds Final  . WBC 03/01/2013 8.5  4.0 - 10.5 K/uL Final  . RBC 03/01/2013 5.33  4.22 - 5.81 MIL/uL Final  . Hemoglobin 03/01/2013 15.1  13.0 - 17.0 g/dL Final  . HCT 41/32/4401 45.2  39.0 - 52.0 % Final  . MCV 03/01/2013 84.8  78.0 - 100.0 fL Final  . MCH 03/01/2013 28.3  26.0 - 34.0 pg Final  . MCHC 03/01/2013 33.4  30.0 - 36.0 g/dL Final  . RDW 02/72/5366 14.8  11.5 - 15.5 % Final  . Platelets 03/01/2013 194  150 - 400 K/uL Final  . Sodium 03/01/2013 138  135 - 145 mEq/L Final  . Potassium 03/01/2013 4.5  3.5 - 5.1 mEq/L Final  . Chloride 03/01/2013 101  96 - 112 mEq/L Final  . CO2 03/01/2013 27  19 - 32 mEq/L Final  . Glucose, Bld 03/01/2013 95  70 - 99 mg/dL Final  . BUN 44/06/4740 17  6 - 23 mg/dL Final  . Creatinine, Ser 03/01/2013 1.14  0.50 - 1.35 mg/dL Final  . Calcium 59/56/3875 9.9  8.4 - 10.5 mg/dL Final  . Total Protein 03/01/2013 7.4  6.0 - 8.3 g/dL Final  . Albumin 64/33/2951 4.1  3.5 - 5.2 g/dL Final  . AST 88/41/6606 22  0 - 37 U/L Final  . ALT 03/01/2013 18  0 - 53 U/L Final  .  Alkaline Phosphatase 03/01/2013 129* 39 - 117 U/L Final  . Total Bilirubin 03/01/2013 0.5  0.3 - 1.2 mg/dL Final  . GFR calc non Af Amer 03/01/2013 68* >90 mL/min Final  . GFR calc Af Amer 03/01/2013 79* >90 mL/min Final   Comment: (NOTE)                          The eGFR has been calculated using the CKD EPI equation.                          This calculation has not been validated in all clinical situations.                          eGFR's persistently <90 mL/min signify possible Chronic Kidney                          Disease.  Marland Kitchen Prothrombin Time 03/01/2013 12.8  11.6 - 15.2 seconds Final  . INR 03/01/2013 0.98  0.00 - 1.49 Final  . MRSA, PCR 03/01/2013 NEGATIVE  NEGATIVE Final  . Staphylococcus aureus 03/01/2013 POSITIVE* NEGATIVE Final   Comment:                                  The Xpert SA Assay (FDA                          approved for NASAL specimens                          in patients over 31 years of age),                          is one component of                          a comprehensive surveillance                          program.  Test performance has                          been validated by [employer] for patients greater                          than or equal to 14 year old.                          It is not intended                          to diagnose infection nor to                          guide or monitor treatment.  . Color, Urine 03/01/2013 YELLOW  YELLOW Final  . APPearance 03/01/2013 CLEAR  CLEAR Final  .  Specific Gravity, Urine 03/01/2013 1.019  1.005 - 1.030 Final  . pH 03/01/2013 5.5  5.0 - 8.0 Final  . Glucose, UA 03/01/2013 NEGATIVE  NEGATIVE mg/dL Final  . Hgb urine dipstick 03/01/2013 NEGATIVE  NEGATIVE Final  . Bilirubin Urine 03/01/2013 NEGATIVE  NEGATIVE Final  . Ketones, ur 03/01/2013 NEGATIVE  NEGATIVE mg/dL Final  . Protein, ur 45/40/9811 NEGATIVE  NEGATIVE mg/dL Final  . Urobilinogen, UA 03/01/2013 0.2  0.0 - 1.0 mg/dL Final  . Nitrite 91/47/8295 NEGATIVE  NEGATIVE Final  . Leukocytes, UA 03/01/2013 NEGATIVE  NEGATIVE Final   MICROSCOPIC NOT DONE ON URINES WITH NEGATIVE PROTEIN, BLOOD, LEUKOCYTES, NITRITE, OR GLUCOSE <1000 mg/dL.     X-Rays:Dg Hip Complete Left  03/01/2013   CLINICAL DATA:  Osteoarthritis  EXAM: LEFT HIP - COMPLETE 2+ VIEW  COMPARISON:  None.  FINDINGS: Frontal pelvis as well as frontal and lateral left hip images were obtained. Patient is status post right total hip replacement.  There is moderate narrowing of the left hip joint. No erosive change. No acute fracture or dislocation present. Atherosclerotic change is noted in the pelvis.  IMPRESSION: Moderate narrowing of the left hip joint consistent with a degree of osteoarthritis. No erosive change. Status post total hip  replacement on the right. No acute fracture or dislocation.   Electronically Signed   By: Bretta Bang M.D.   On: 03/01/2013 08:56   Dg Pelvis Portable  03/06/2013   CLINICAL DATA:  Postop from left hip replacement.  EXAM: PORTABLE PELVIS 1-2 VIEWS  COMPARISON:  03/01/2013  FINDINGS: Bipolar left hip prosthesis seen in appropriate position. No evidence of fracture or dislocation. Old right hip prosthesis remains stable in appearance.  IMPRESSION: Expected postoperative appearance of left hip prosthesis. No evidence of fracture or dislocation.   Electronically Signed   By: Myles Rosenthal M.D.   On: 03/06/2013 17:50   Dg C-arm 61-120 Min-no Report  03/06/2013   CLINICAL DATA: left total hip replacement   C-ARM 61-120 MINUTES  Fluoroscopy was utilized by the requesting physician.  No radiographic  interpretation.     EKG:No orders found for this or any previous visit.   Hospital Course: Patient was admitted to [facility] and taken to the OR and underwent the above state procedure without complications.  Patient tolerated the procedure well and was later transferred to the recovery room and then to the orthopaedic floor for postoperative care.  They were given PO and IV analgesics for pain control following their surgery.  They were given 24 hours of postoperative antibiotics of  Anti-infectives   Start     Dose/Rate Route Frequency Ordered Stop   03/06/13 2100  ceFAZolin (ANCEF) IVPB 2 g/50 mL premix     2 g 100 mL/hr over 30 Minutes Intravenous Every 6 hours 03/06/13 1829 03/07/13 0323   03/06/13 1200  ceFAZolin (ANCEF) IVPB 2 g/50 mL premix     2 g 100 mL/hr over 30 Minutes Intravenous On call to O.R. 03/06/13 1155 03/06/13 1430     and started on DVT prophylaxis in the form of Xarelto.   PT and OT were ordered for total hip protocol.  The patient was allowed to be WBAT with therapy. Discharge planning was consulted to help with postop disposition and equipment needs.  Patient had a  good night on the evening of surgery.  They started to get up OOB with therapy on day one.  Hemovac drain was pulled without difficulty.  Continued to work  with therapy into day two.  Dressing was changed on day two and the incision was healing well.  Patient was seen in rounds and was ready to go home.   Activity:WBAT Follow-up:in 2 weeks Disposition - Home Discharged Condition: good      Medication List    STOP taking these medications       oxyCODONE-acetaminophen 5-325 MG per tablet  Commonly known as:  PERCOCET/ROXICET      TAKE these medications       acetaminophen 500 MG tablet  Commonly known as:  TYLENOL  Take 1,000 mg by mouth every 6 (six) hours as needed for headache.     allopurinol 300 MG tablet  Commonly known as:  ZYLOPRIM  Take 300 mg by mouth daily with breakfast.     methocarbamol 500 MG tablet  Commonly known as:  ROBAXIN  Take 1 tablet (500 mg total) by mouth every 6 (six) hours as needed for muscle spasms.     oxyCODONE 5 MG immediate release tablet  Commonly known as:  Oxy IR/ROXICODONE  Take 1-3 tablets (5-15 mg total) by mouth every 3 (three) hours as needed for breakthrough pain.     rivaroxaban 10 MG Tabs tablet  Commonly known as:  XARELTO  Take 1 tablet (10 mg total) by mouth daily with breakfast.     traMADol 50 MG tablet  Commonly known as:  ULTRAM  Take 1-2 tablets (50-100 mg total) by mouth every 6 (six) hours as needed for moderate pain.           Follow-up Information   Follow up with Loanne Drilling, MD. Schedule an appointment as soon as possible for a visit on 03/21/2013. (Call [phone] Monday to make the appointment)    Specialty:  Orthopedic Surgery   Contact information:   [address] [place] [address] [phone]       Signed: Patrica Duel 03/14/2013, 2:48 PM

## 2014-02-13 ENCOUNTER — Ambulatory Visit: Payer: Self-pay

## 2014-03-03 ENCOUNTER — Other Ambulatory Visit (HOSPITAL_COMMUNITY): Payer: Self-pay | Admitting: Orthopedic Surgery

## 2014-03-03 DIAGNOSIS — T84030A Mechanical loosening of internal right hip prosthetic joint, initial encounter: Secondary | ICD-10-CM

## 2014-03-03 DIAGNOSIS — M25551 Pain in right hip: Secondary | ICD-10-CM

## 2014-03-12 ENCOUNTER — Encounter (HOSPITAL_COMMUNITY): Payer: BC Managed Care – PPO

## 2014-03-12 ENCOUNTER — Encounter (HOSPITAL_COMMUNITY)
Admission: RE | Admit: 2014-03-12 | Discharge: 2014-03-12 | Disposition: A | Discharge status: Home / Self Care | Payer: BC Managed Care – PPO | Source: Ambulatory Visit | Attending: Orthopedic Surgery | Admitting: Orthopedic Surgery

## 2014-03-12 DIAGNOSIS — M25551 Pain in right hip: Secondary | ICD-10-CM | POA: Diagnosis not present

## 2014-03-12 DIAGNOSIS — Z96643 Presence of artificial hip joint, bilateral: Secondary | ICD-10-CM | POA: Insufficient documentation

## 2014-03-12 DIAGNOSIS — T84030A Mechanical loosening of internal right hip prosthetic joint, initial encounter: Secondary | ICD-10-CM

## 2014-03-12 MED ORDER — TECHNETIUM TC 99M MEDRONATE IV KIT
26.0000 | PACK | Freq: Once | INTRAVENOUS | Status: AC | PRN
  Administered 2014-03-12: 26 via INTRAVENOUS

## 2014-05-01 ENCOUNTER — Ambulatory Visit: Payer: Self-pay | Admitting: Orthopedic Surgery

## 2014-05-01 NOTE — Progress Notes (Signed)
Preoperative surgical orders have been place into the Epic hospital system for Paul Byrd on 05/01/2014, 5:47 PM  by Mickel Crow for surgery on 05-20-2014.  Preop hardware removal orders including IV Tylenol and IV Decadron as long as there are no contraindications to the above medications. Arlee Muslim, PA-C

## 2014-05-15 ENCOUNTER — Encounter (HOSPITAL_COMMUNITY)
Admission: RE | Admit: 2014-05-15 | Discharge: 2014-05-15 | Disposition: A | Discharge status: Home / Self Care | Payer: BC Managed Care – PPO | Source: Ambulatory Visit | Attending: Orthopedic Surgery | Admitting: Orthopedic Surgery

## 2014-05-15 ENCOUNTER — Encounter (HOSPITAL_COMMUNITY): Payer: Self-pay

## 2014-05-15 DIAGNOSIS — N189 Chronic kidney disease, unspecified: Secondary | ICD-10-CM | POA: Diagnosis not present

## 2014-05-15 DIAGNOSIS — Z905 Acquired absence of kidney: Secondary | ICD-10-CM | POA: Insufficient documentation

## 2014-05-15 DIAGNOSIS — M25551 Pain in right hip: Secondary | ICD-10-CM | POA: Insufficient documentation

## 2014-05-15 DIAGNOSIS — Z01812 Encounter for preprocedural laboratory examination: Secondary | ICD-10-CM | POA: Diagnosis present

## 2014-05-15 DIAGNOSIS — Z96641 Presence of right artificial hip joint: Secondary | ICD-10-CM | POA: Diagnosis not present

## 2014-05-15 DIAGNOSIS — Z85528 Personal history of other malignant neoplasm of kidney: Secondary | ICD-10-CM | POA: Diagnosis not present

## 2014-05-15 LAB — BASIC METABOLIC PANEL
ANION GAP: 8 (ref 5–15)
BUN: 16 mg/dL (ref 6–23)
CALCIUM: 9.3 mg/dL (ref 8.4–10.5)
CHLORIDE: 103 mmol/L (ref 96–112)
CO2: 28 mmol/L (ref 19–32)
CREATININE: 1.18 mg/dL (ref 0.50–1.35)
GFR, EST AFRICAN AMERICAN: 75 mL/min — AB (ref >90)
GFR, EST NON AFRICAN AMERICAN: 65 mL/min — AB (ref >90)
Glucose, Bld: 94 mg/dL (ref 70–99)
POTASSIUM: 4.5 mmol/L (ref 3.5–5.1)
SODIUM: 139 mmol/L (ref 135–145)

## 2014-05-15 LAB — CBC
HCT: 46 % (ref 39.0–52.0)
HEMOGLOBIN: 15 g/dL (ref 13.0–17.0)
MCH: 28.7 pg (ref 26.0–34.0)
MCHC: 32.6 g/dL (ref 30.0–36.0)
MCV: 88 fL (ref 78.0–100.0)
Platelets: 173 10*3/uL (ref 150–400)
RBC: 5.23 MIL/uL (ref 4.22–5.81)
RDW: 13.8 % (ref 11.5–15.5)
WBC: 8.3 10*3/uL (ref 4.0–10.5)

## 2014-05-15 NOTE — Patient Instructions (Addendum)
[place]  05/15/2014   Your procedure is scheduled on:   05-20-2014 Tuesday  Enter through [facility]  Entrance and follow signs to [facility]. Arrive at    12 noon.  Call this number if you have problems the morning of surgery: [phone]  Or Presurgical Testing [phone].   For Living Will and/or Health Care Power Attorney Forms: please provide copy for your medical record,may bring AM of surgery(Forms should be already notarized -we do not provide this service).(05-15-14  No information preferred today).      Do not eat food/ or drink: After Midnight.  Exception: may have clear liquids:up to 6 Hours before arrival. Nothing after: 0800 am  Clear liquids include soda, tea, black coffee, apple or grape juice, broth.  Take these medicines the morning of surgery with A SIP OF WATER: Allopurinol. Tamsulosin. Oxycodone if need.   Do not wear jewelry, make-up or nail polish.  Do not wear deodorant, lotions, powders, or perfumes.   Do not shave legs and under arms- 48 hours(2 days) prior to first CHG shower.(Shaving face and neck okay.)  Do not bring valuables to the hospital.(Hospital is not responsible for lost valuables).  Contacts, dentures or removable bridgework, body piercing, hair pins may not be worn into surgery.  Leave suitcase in the car. After surgery it may be brought to your room.  For patients admitted to the hospital, checkout time is 11:00 AM the day of discharge.(Restricted visitors-Any Persons displaying flu-like symptoms or illness).    Patients discharged the day of surgery will not be allowed to drive home. Must have responsible person with you x 24 hours once discharged.  Name and phone number of your driver: Colletta [place], spouse [phone] cell     Please read over the following fact sheets that you were given:  CHG(Chlorhexidine Gluconate 4% Surgical Soap) use, MRSA Information, Blood Transfusion fact sheet, Incentive Spirometry  Instruction.  Remember : Type/Screen "Blue armbands" - may not be removed once applied(would result in being retested AM of surgery, if removed).         [facility] - Preparing for Surgery Before surgery, you can play an important role.  Because skin is not sterile, your skin needs to be as free of germs as possible.  You can reduce the number of germs on your skin by washing with CHG (chlorahexidine gluconate) soap before surgery.  CHG is an antiseptic cleaner which kills germs and bonds with the skin to continue killing germs even after washing. Please DO NOT use if you have an allergy to CHG or antibacterial soaps.  If your skin becomes reddened/irritated stop using the CHG and inform your nurse when you arrive at Short Stay. Do not shave (including legs and underarms) for at least 48 hours prior to the first CHG shower.  You may shave your face/neck. Please follow these instructions carefully:  1.  Shower with CHG Soap the night before surgery and the  morning of Surgery.  2.  If you choose to wash your hair, wash your hair first as usual with your  normal  shampoo.  3.  After you shampoo, rinse your hair and body thoroughly to remove the  shampoo.                           4.  Use CHG as you would any other liquid soap.  You can apply chg directly  to the skin  and wash                       Gently with a scrungie or clean washcloth.  5.  Apply the CHG Soap to your body ONLY FROM THE NECK DOWN.   Do not use on face/ open                           Wound or open sores. Avoid contact with eyes, ears mouth and genitals (private parts).                       Wash face,  Genitals (private parts) with your normal soap.             6.  Wash thoroughly, paying special attention to the area where your surgery  will be performed.  7.  Thoroughly rinse your body with warm water from the neck down.  8.  DO NOT shower/wash with your normal soap after using and rinsing off  the CHG Soap.                 9.  Pat yourself dry with a clean towel.            10.  Wear clean pajamas.            11.  Place clean sheets on your bed the night of your first shower and do not  sleep with pets. Day of Surgery : Do not apply any lotions/deodorants the morning of surgery.  Please wear clean clothes to the hospital/surgery center.  FAILURE TO FOLLOW THESE INSTRUCTIONS MAY RESULT IN THE CANCELLATION OF YOUR SURGERY PATIENT SIGNATURE_________________________________  NURSE SIGNATURE__________________________________  ________________________________________________________________________   Adam Phenix  An incentive spirometer is a tool that can help keep your lungs clear and active. This tool measures how well you are filling your lungs with each breath. Taking long deep breaths may help reverse or decrease the chance of developing breathing (pulmonary) problems (especially infection) following:  A long period of time when you are unable to move or be active. BEFORE THE PROCEDURE   If the spirometer includes an indicator to show your best effort, your nurse or respiratory therapist will set it to a desired goal.  If possible, sit up straight or lean slightly forward. Try not to slouch.  Hold the incentive spirometer in an upright position. INSTRUCTIONS FOR USE  1. Sit on the edge of your bed if possible, or sit up as far as you can in bed or on a chair. 2. Hold the incentive spirometer in an upright position. 3. Breathe out normally. 4. Place the mouthpiece in your mouth and seal your lips tightly around it. 5. Breathe in slowly and as deeply as possible, raising the piston or the ball toward the top of the column. 6. Hold your breath for 3-5 seconds or for as long as possible. Allow the piston or ball to fall to the bottom of the column. 7. Remove the mouthpiece from your mouth and breathe out normally. 8. Rest for a few seconds and repeat Steps 1 through 7 at least 10 times every 1-2  hours when you are awake. Take your time and take a few normal breaths between deep breaths. 9. The spirometer may include an indicator to show your best effort. Use the indicator as a goal to work toward during each repetition. 10. After each set of  10 deep breaths, practice coughing to be sure your lungs are clear. If you have an incision (the cut made at the time of surgery), support your incision when coughing by placing a pillow or rolled up towels firmly against it. Once you are able to get out of bed, walk around indoors and cough well. You may stop using the incentive spirometer when instructed by your caregiver.  RISKS AND COMPLICATIONS  Take your time so you do not get dizzy or light-headed.  If you are in pain, you may need to take or ask for pain medication before doing incentive spirometry. It is harder to take a deep breath if you are having pain. AFTER USE  Rest and breathe slowly and easily.  It can be helpful to keep track of a log of your progress. Your caregiver can provide you with a simple table to help with this. If you are using the spirometer at home, follow these instructions: [place] IF:   You are having difficultly using the spirometer.  You have trouble using the spirometer as often as instructed.  Your pain medication is not giving enough relief while using the spirometer.  You develop fever of 100.5 F (38.1 C) or higher. SEEK IMMEDIATE MEDICAL CARE IF:   You cough up bloody sputum that had not been present before.  You develop fever of 102 F (38.9 C) or greater.  You develop worsening pain at or near the incision site. MAKE SURE YOU:   Understand these instructions.  Will watch your condition.  Will get help right away if you are not doing well or get worse. Document Released: 08/08/2006 Document Revised: 06/20/2011 Document Reviewed: 10/09/2006 [facility] Patient Information 2014 [place],  [place].   ________________________________________________________________________

## 2014-05-15 NOTE — Pre-Procedure Instructions (Signed)
05-15-14 Stop/Bang score = 4 with PAT visit, no PCP MD at present time.

## 2014-05-19 MED ORDER — DEXTROSE 5 % IV SOLN
3.0000 g | INTRAVENOUS | Status: AC
  Filled 2014-05-19: qty 3000

## 2014-05-20 ENCOUNTER — Ambulatory Visit (HOSPITAL_COMMUNITY)
Admission: RE | Admit: 2014-05-20 | Discharge: 2014-05-20 | Disposition: A | Discharge status: Home / Self Care | Payer: BC Managed Care – PPO | Source: Ambulatory Visit | Attending: Orthopedic Surgery | Admitting: Orthopedic Surgery

## 2014-05-20 ENCOUNTER — Encounter (HOSPITAL_COMMUNITY): Payer: Self-pay | Admitting: *Deleted

## 2014-05-20 ENCOUNTER — Ambulatory Visit (HOSPITAL_COMMUNITY): Payer: BC Managed Care – PPO | Admitting: Anesthesiology

## 2014-05-20 ENCOUNTER — Encounter (HOSPITAL_COMMUNITY)
Admission: RE | Disposition: A | Discharge status: Home / Self Care | Payer: Self-pay | Source: Ambulatory Visit | Attending: Orthopedic Surgery

## 2014-05-20 DIAGNOSIS — Z96643 Presence of artificial hip joint, bilateral: Secondary | ICD-10-CM | POA: Diagnosis not present

## 2014-05-20 DIAGNOSIS — Y929 Unspecified place or not applicable: Secondary | ICD-10-CM | POA: Diagnosis not present

## 2014-05-20 DIAGNOSIS — Z87891 Personal history of nicotine dependence: Secondary | ICD-10-CM | POA: Diagnosis not present

## 2014-05-20 DIAGNOSIS — N189 Chronic kidney disease, unspecified: Secondary | ICD-10-CM | POA: Diagnosis not present

## 2014-05-20 DIAGNOSIS — Z79899 Other long term (current) drug therapy: Secondary | ICD-10-CM | POA: Insufficient documentation

## 2014-05-20 DIAGNOSIS — T8484XA Pain due to internal orthopedic prosthetic devices, implants and grafts, initial encounter: Secondary | ICD-10-CM | POA: Diagnosis not present

## 2014-05-20 DIAGNOSIS — Y838 Other surgical procedures as the cause of abnormal reaction of the patient, or of later complication, without mention of misadventure at the time of the procedure: Secondary | ICD-10-CM | POA: Diagnosis not present

## 2014-05-20 DIAGNOSIS — Z85528 Personal history of other malignant neoplasm of kidney: Secondary | ICD-10-CM | POA: Insufficient documentation

## 2014-05-20 DIAGNOSIS — M1990 Unspecified osteoarthritis, unspecified site: Secondary | ICD-10-CM | POA: Insufficient documentation

## 2014-05-20 DIAGNOSIS — M25551 Pain in right hip: Secondary | ICD-10-CM | POA: Diagnosis present

## 2014-05-20 HISTORY — PX: HARDWARE REMOVAL: SHX979

## 2014-05-20 SURGERY — REMOVAL, HARDWARE
Anesthesia: General | Site: Hip | Laterality: Right

## 2014-05-20 MED ORDER — PROPOFOL 10 MG/ML IV BOLUS
INTRAVENOUS | Status: DC | PRN
  Administered 2014-05-20: 200 mg via INTRAVENOUS

## 2014-05-20 MED ORDER — LACTATED RINGERS IV SOLN
INTRAVENOUS | Status: DC
Start: 2014-05-20 — End: 2014-05-20
  Administered 2014-05-20: 1000 mL via INTRAVENOUS

## 2014-05-20 MED ORDER — METHOCARBAMOL 500 MG PO TABS: 500.0000 mg | ORAL_TABLET | Freq: Four times a day (QID) | ORAL | Status: DC

## 2014-05-20 MED ORDER — ONDANSETRON HCL 4 MG/2ML IJ SOLN
INTRAMUSCULAR | Status: AC
  Filled 2014-05-20: qty 2

## 2014-05-20 MED ORDER — PROMETHAZINE HCL 25 MG/ML IJ SOLN
6.2500 mg | INTRAMUSCULAR | Status: DC | PRN
Start: 2014-05-20 — End: 2014-05-20

## 2014-05-20 MED ORDER — MIDAZOLAM HCL 2 MG/2ML IJ SOLN
INTRAMUSCULAR | Status: AC
  Filled 2014-05-20: qty 2

## 2014-05-20 MED ORDER — BUPIVACAINE HCL (PF) 0.25 % IJ SOLN
INTRAMUSCULAR | Status: DC | PRN
  Administered 2014-05-20: 30 mL

## 2014-05-20 MED ORDER — FENTANYL CITRATE 0.05 MG/ML IJ SOLN
INTRAMUSCULAR | Status: AC
  Filled 2014-05-20: qty 2

## 2014-05-20 MED ORDER — PROPOFOL 10 MG/ML IV BOLUS
INTRAVENOUS | Status: AC
  Filled 2014-05-20: qty 20

## 2014-05-20 MED ORDER — EPHEDRINE SULFATE 50 MG/ML IJ SOLN
INTRAMUSCULAR | Status: DC | PRN
  Administered 2014-05-20: 10 mg via INTRAVENOUS

## 2014-05-20 MED ORDER — CHLORHEXIDINE GLUCONATE 4 % EX LIQD: 60.0000 mL | Freq: Once | CUTANEOUS | Status: DC

## 2014-05-20 MED ORDER — DEXTROSE 5 % IV SOLN
3.0000 g | Freq: Once | INTRAVENOUS | Status: AC
  Administered 2014-05-20: 3 g via INTRAVENOUS
  Filled 2014-05-20: qty 3000

## 2014-05-20 MED ORDER — LIDOCAINE HCL (CARDIAC) 20 MG/ML IV SOLN
INTRAVENOUS | Status: DC | PRN
  Administered 2014-05-20: 100 mg via INTRAVENOUS

## 2014-05-20 MED ORDER — ONDANSETRON HCL 4 MG/2ML IJ SOLN
INTRAMUSCULAR | Status: DC | PRN
  Administered 2014-05-20: 4 mg via INTRAVENOUS

## 2014-05-20 MED ORDER — LIDOCAINE HCL (CARDIAC) 20 MG/ML IV SOLN
INTRAVENOUS | Status: AC
  Filled 2014-05-20: qty 5

## 2014-05-20 MED ORDER — FENTANYL CITRATE 0.05 MG/ML IJ SOLN: 25.0000 ug | INTRAMUSCULAR | Status: DC | PRN

## 2014-05-20 MED ORDER — ACETAMINOPHEN 10 MG/ML IV SOLN
1000.0000 mg | Freq: Once | INTRAVENOUS | Status: AC
  Administered 2014-05-20: 1000 mg via INTRAVENOUS
  Filled 2014-05-20: qty 100

## 2014-05-20 MED ORDER — SODIUM CHLORIDE 0.9 % IV SOLN: INTRAVENOUS | Status: DC

## 2014-05-20 MED ORDER — MEPERIDINE HCL 50 MG/ML IJ SOLN: 6.2500 mg | INTRAMUSCULAR | Status: DC | PRN

## 2014-05-20 MED ORDER — MIDAZOLAM HCL 5 MG/5ML IJ SOLN
INTRAMUSCULAR | Status: DC | PRN
  Administered 2014-05-20: 2 mg via INTRAVENOUS

## 2014-05-20 MED ORDER — FENTANYL CITRATE 0.05 MG/ML IJ SOLN
INTRAMUSCULAR | Status: DC | PRN
  Administered 2014-05-20 (×2): 50 ug via INTRAVENOUS

## 2014-05-20 MED ORDER — BUPIVACAINE HCL (PF) 0.25 % IJ SOLN
INTRAMUSCULAR | Status: AC
  Filled 2014-05-20: qty 30

## 2014-05-20 MED ORDER — LACTATED RINGERS IV SOLN: INTRAVENOUS | Status: DC

## 2014-05-20 MED ORDER — HYDROCODONE-ACETAMINOPHEN 5-325 MG PO TABS: 1.0000 | ORAL_TABLET | Freq: Four times a day (QID) | ORAL | Status: DC | PRN

## 2014-05-20 MED ORDER — DEXAMETHASONE SODIUM PHOSPHATE 10 MG/ML IJ SOLN
10.0000 mg | Freq: Once | INTRAMUSCULAR | Status: AC
  Administered 2014-05-20: 10 mg via INTRAVENOUS

## 2014-05-20 SURGICAL SUPPLY — 44 items
BANDAGE ELASTIC 6 VELCRO ST LF (GAUZE/BANDAGES/DRESSINGS) IMPLANT
BANDAGE ESMARK 6X9 LF (GAUZE/BANDAGES/DRESSINGS) IMPLANT
BNDG ESMARK 6X9 LF (GAUZE/BANDAGES/DRESSINGS)
CLOSURE WOUND 1/2 X4 (GAUZE/BANDAGES/DRESSINGS) ×1
CUFF TOURN SGL QUICK 18 (TOURNIQUET CUFF) IMPLANT
CUFF TOURN SGL QUICK 34 (TOURNIQUET CUFF)
CUFF TRNQT CYL 34X4X40X1 (TOURNIQUET CUFF) IMPLANT
DRAPE C-ARM 42X120 X-RAY (DRAPES) IMPLANT
DRAPE C-ARMOR (DRAPES) IMPLANT
DRAPE EXTREMITY T 121X128X90 (DRAPE) ×3 IMPLANT
DRAPE INCISE IOBAN 66X45 STRL (DRAPES) ×3 IMPLANT
DRAPE ORTHO SPLIT 77X108 STRL (DRAPES)
DRAPE SURG ORHT 6 SPLT 77X108 (DRAPES) IMPLANT
DRSG ADAPTIC 3X8 NADH LF (GAUZE/BANDAGES/DRESSINGS) IMPLANT
DRSG EMULSION OIL 3X3 NADH (GAUZE/BANDAGES/DRESSINGS) ×3 IMPLANT
DRSG MEPILEX BORDER 4X4 (GAUZE/BANDAGES/DRESSINGS) ×3 IMPLANT
DRSG PAD ABDOMINAL 8X10 ST (GAUZE/BANDAGES/DRESSINGS) IMPLANT
DURAPREP 26ML APPLICATOR (WOUND CARE) ×3 IMPLANT
ELECT REM PT RETURN 9FT ADLT (ELECTROSURGICAL) ×3
ELECTRODE REM PT RTRN 9FT ADLT (ELECTROSURGICAL) ×1 IMPLANT
GAUZE SPONGE 4X4 12PLY STRL (GAUZE/BANDAGES/DRESSINGS) IMPLANT
GLOVE BIO SURGEON STRL SZ7.5 (GLOVE) ×3 IMPLANT
GLOVE BIO SURGEON STRL SZ8 (GLOVE) ×6 IMPLANT
GLOVE BIOGEL PI IND STRL 8 (GLOVE) ×3 IMPLANT
GLOVE BIOGEL PI INDICATOR 8 (GLOVE) ×6
GOWN STRL REUS W/TWL LRG LVL3 (GOWN DISPOSABLE) ×3 IMPLANT
GOWN STRL REUS W/TWL XL LVL3 (GOWN DISPOSABLE) ×3 IMPLANT
KIT BASIN OR (CUSTOM PROCEDURE TRAY) ×3 IMPLANT
MANIFOLD NEPTUNE II (INSTRUMENTS) ×3 IMPLANT
NS IRRIG 1000ML POUR BTL (IV SOLUTION) ×3 IMPLANT
PACK GENERAL/GYN (CUSTOM PROCEDURE TRAY) ×3 IMPLANT
PACK TOTAL JOINT (CUSTOM PROCEDURE TRAY) IMPLANT
PADDING CAST COTTON 6X4 STRL (CAST SUPPLIES) IMPLANT
POSITIONER SURGICAL ARM (MISCELLANEOUS) ×3 IMPLANT
STAPLER VISISTAT 35W (STAPLE) IMPLANT
STRIP CLOSURE SKIN 1/2X4 (GAUZE/BANDAGES/DRESSINGS) ×2 IMPLANT
SUT MNCRL AB 4-0 PS2 18 (SUTURE) ×3 IMPLANT
SUT VIC AB 0 CT1 36 (SUTURE) IMPLANT
SUT VIC AB 1 CT1 36 (SUTURE) ×3 IMPLANT
SUT VIC AB 2-0 CT1 27 (SUTURE) ×4
SUT VIC AB 2-0 CT1 TAPERPNT 27 (SUTURE) ×2 IMPLANT
TOWEL OR 17X26 10 PK STRL BLUE (TOWEL DISPOSABLE) ×3 IMPLANT
UNDERPAD 30X30 INCONTINENT (UNDERPADS AND DIAPERS) IMPLANT
WATER STERILE IRR 1500ML POUR (IV SOLUTION) IMPLANT

## 2014-05-20 NOTE — Transfer of Care (Signed)
Immediate Anesthesia Transfer of Care Note  Patient: Paul Byrd  Procedure(s) Performed: Procedure(s): RIGHT HIP HARDWARE REMOVAL (Right)  Patient Location: PACU  Anesthesia Type:General  Level of Consciousness: awake  Airway & Oxygen Therapy: Patient Spontanous Breathing and Patient connected to face mask oxygen  Post-op Assessment: Report given to RN and Post -op Vital signs reviewed and stable  Post vital signs: Reviewed and stable  Last Vitals: There were no vitals filed for this visit.  Complications: No apparent anesthesia complications

## 2014-05-20 NOTE — Brief Op Note (Signed)
05/20/2014  3:32 PM  PATIENT:  [name]  64 y.o. male  PRE-OPERATIVE DIAGNOSIS:  PAINFUL HARDWARE OF RIGHT HIP  POST-OPERATIVE DIAGNOSIS:  PAINFUL HARDWARE OF RIGHT HIP  PROCEDURE:  Procedure(s): RIGHT HIP HARDWARE REMOVAL (Right)  SURGEON:  Surgeon(s) and Role:    * [name], MD - Primary  PHYSICIAN ASSISTANT:   ASSISTANTS: Arlee Muslim, PA-C   ANESTHESIA:   general  EBL:   miminal  BLOOD ADMINISTERED:none  DRAINS: none   LOCAL MEDICATIONS USED:  MARCAINE     COUNTS:  YES  TOURNIQUET:  * No tourniquets in log *  DICTATION: .Other Dictation: Dictation Number [id number]  PLAN OF CARE: Discharge to home after PACU  PATIENT DISPOSITION:  PACU - hemodynamically stable.

## 2014-05-20 NOTE — Anesthesia Preprocedure Evaluation (Signed)
Anesthesia Evaluation  Patient identified by MRN, date of birth, ID band Patient awake    Reviewed: Allergy & Precautions, NPO status , Patient's Chart, lab work & pertinent test results  Airway Mallampati: II  TM Distance: >3 FB Neck ROM: Full    Dental no notable dental hx.    Pulmonary neg pulmonary ROS, former smoker,  breath sounds clear to auscultation  Pulmonary exam normal       Cardiovascular negative cardio ROS  Rhythm:Regular Rate:Normal     Neuro/Psych negative neurological ROS  negative psych ROS   GI/Hepatic negative GI ROS, Neg liver ROS,   Endo/Other  negative endocrine ROS  Renal/GU negative Renal ROS  negative genitourinary   Musculoskeletal negative musculoskeletal ROS (+)   Abdominal   Peds negative pediatric ROS (+)  Hematology negative hematology ROS (+)   Anesthesia Other Findings   Reproductive/Obstetrics negative OB ROS                             Anesthesia Physical Anesthesia Plan  ASA: II  Anesthesia Plan: General   Post-op Pain Management:    Induction: Intravenous  Airway Management Planned: LMA  Additional Equipment:   Intra-op Plan:   Post-operative Plan: Extubation in OR  Informed Consent: I have reviewed the patients History and Physical, chart, labs and discussed the procedure including the risks, benefits and alternatives for the proposed anesthesia with the patient or authorized representative who has indicated his/her understanding and acceptance.   Dental advisory given  Plan Discussed with: CRNA  Anesthesia Plan Comments:         Anesthesia Quick Evaluation

## 2014-05-20 NOTE — Interval H&P Note (Signed)
History and Physical Interval Note:  05/20/2014 2:31 PM  Paul Byrd  has presented today for surgery, with the diagnosis of PAINFUL HARDWARE OF RIGHT HIP  The various methods of treatment have been discussed with the patient and family. After consideration of risks, benefits and other options for treatment, the patient has consented to  Procedure(s): RIGHT HIP HARDWARE REMOVAL (Right) as a surgical intervention .  The patient's history has been reviewed, patient examined, no change in status, stable for surgery.  I have reviewed the patient's chart and labs.  Questions were answered to the patient's satisfaction.     [name]

## 2014-05-20 NOTE — H&P (Signed)
CC- Paul Byrd is a 64 y.o. male who presents with right hip pain  Hip Pain: Patient complains of right hip pain. Onset of the symptoms was several years ago. Inciting event: he had a right THA revision several years ago and has a cable around his right femur which is causing significant pain. His fracture is fully healed and bone scan shows no prosthetic loosening. Current symptoms include lateral hip pain. Associated symptoms: none. Aggravating symptoms: standing, walking and laying on right side.Patient has had prior hip problems.  Evaluation to date: plain films, which were abnormal  with broken cable right femur.  Treatment to date: OTC analgesics, which have been not very effective.  Past Medical History  Diagnosis Date  . Chronic kidney disease     nephrectomy left kidney-cancer  . Arthritis   . Cancer 2001    kidney-left(surgical removal only)  . Headache(784.0)     rare now    Past Surgical History  Procedure Laterality Date  . Joint replacement      right hip x 2  . Tonsillectomy    . Nephrectomy  2001    left kidney for cancer  . Scar tissue removal      right chin  . Shoulder surgery      right x 2, left x 1  . Total hip arthroplasty Left 03/06/2013    Procedure: LEFT TOTAL HIP ARTHROPLASTY ANTERIOR APPROACH;  Surgeon: [name], MD;  Location: [facility];  Service: Orthopedics;  Laterality: Left;    Prior to Admission medications   Medication Sig Start Date End Date Taking? Authorizing Provider  acetaminophen (TYLENOL) 500 MG tablet Take 1,000 mg by mouth every 6 (six) hours as needed for headache.   Yes Historical Provider, MD  allopurinol (ZYLOPRIM) 300 MG tablet Take 300 mg by mouth daily with breakfast.   Yes Historical Provider, MD  tamsulosin (FLOMAX) 0.4 MG CAPS capsule Take 0.4 mg by mouth daily.   Yes Historical Provider, MD  methocarbamol (ROBAXIN) 500 MG tablet Take 1 tablet (500 mg total) by mouth every 6 (six) hours as needed for muscle  spasms. Patient not taking: Reported on 05/12/2014 03/07/13   [name], MD  oxyCODONE (OXY IR/ROXICODONE) 5 MG immediate release tablet Take 1-3 tablets (5-15 mg total) by mouth every 3 (three) hours as needed for breakthrough pain. Patient not taking: Reported on 05/12/2014 03/07/13   [name], MD  rivaroxaban (XARELTO) 10 MG TABS tablet Take 1 tablet (10 mg total) by mouth daily with breakfast. Patient not taking: Reported on 05/12/2014 03/07/13   [name], MD  traMADol (ULTRAM) 50 MG tablet Take 1-2 tablets (50-100 mg total) by mouth every 6 (six) hours as needed for moderate pain. Patient not taking: Reported on 05/12/2014 03/07/13   [name], MD    Physical Examination: General appearance - alert, well appearing, and in no distress Mental status - alert, oriented to person, place, and time Chest - clear to auscultation, no wheezes, rales or rhonchi, symmetric air entry Heart - normal rate, regular rhythm, normal S1, S2, no murmurs, rubs, clicks or gallops Abdomen - soft, nontender, nondistended, no masses or organomegaly Neurological - alert, oriented, normal speech, no focal findings or movement disorder noted  A right hip exam was performed. GENERAL: no acute distress SKIN: intact SWELLING: none WARMTH: no warmth TENDERNESS: maximal at greater trochanter STRENGTH: normal GAIT: antalgic  ASSESSMENT:Painful hardware right hip.   Plan Hardware removal right hip. Discussed  in detail with patient who elects to proceed.  Dione Plover Kismet Facemire, MD    05/20/2014, 2:27 PM

## 2014-05-20 NOTE — Anesthesia Postprocedure Evaluation (Signed)
  Anesthesia Post-op Note  Patient: Paul Byrd  Procedure(s) Performed: Procedure(s) (LRB): RIGHT HIP HARDWARE REMOVAL (Right)  Patient Location: PACU  Anesthesia Type: General  Level of Consciousness: awake and alert   Airway and Oxygen Therapy: Patient Spontanous Breathing  Post-op Pain: mild  Post-op Assessment: Post-op Vital signs reviewed, Patient's Cardiovascular Status Stable, Respiratory Function Stable, Patent Airway and No signs of Nausea or vomiting  Last Vitals: There were no vitals filed for this visit.  Post-op Vital Signs: stable   Complications: No apparent anesthesia complications

## 2014-05-21 ENCOUNTER — Encounter (HOSPITAL_COMMUNITY): Payer: Self-pay | Admitting: Orthopedic Surgery

## 2014-05-21 NOTE — Op Note (Signed)
Paul Byrd, Paul Byrd             ACCOUNT NO.:  [id number]  MEDICAL RECORD NO.:  [id number]  LOCATION:  [facility]                         FACILITY:  [facility]  PHYSICIAN:  Gaynelle Arabian, M.D.    DATE OF BIRTH:  July 15, 1950  DATE OF PROCEDURE:  05/20/2014 DATE OF DISCHARGE:  05/20/2014                              OPERATIVE REPORT   PREOPERATIVE DIAGNOSIS:  Painful hardware, right hip.  POSTOPERATIVE DIAGNOSIS:  Painful hardware, right hip.  PROCEDURE:  Hardware removal of right hip.  SURGEON:  Gaynelle Arabian, M.D.  ASSISTANT:  Alexzandrew L. Dara Lords, PA-C  ANESTHESIA:  General.  ESTIMATED BLOOD LOSS:  Minimal.  DRAINS:  None.  COMPLICATIONS:  None.  CONDITION:  Stable to recovery.  BRIEF CLINICAL NOTE:  [name] is a 64 year old male, who has significant pain along the lateral aspect of his right hip.  He has had a previous total hip arthroplasty then revision done elsewhere with a fracture and cabling.  He has a broken cable corresponding to the point on his right thigh where the pain is most maximal.  He had a recent bone scan to rule out periprosthetic loosening and that came back normal.  It was felt that the pain was coming from this broken cable.  He presents now for hardware removal.  PROCEDURE IN DETAIL:  After successful administration of general anesthetic, the patient was placed in the left lateral decubitus position with the right side up and held with the hip positioner.  His right lower extremity was then prepped and draped in usual sterile fashion.  I palpated over his lateral side to the point where the trochanteric flare is.  A small incision was made about 2 inches in length over this area.  Skin was cut with a 10 blade through subcutaneous tissue to the fascia lata which was incised in line with the skin incision.  The broken end of the cable was identified.  I traced it all the way down to bone.  Unfortunately, the clamp for the cable was imbedded in  bone.  It was felt that it would be destructive of the bone and it was too far anterior to safely extract that from the bone.  I thus cut the cable all the way down at the level of the clamp. The broken cable was removed from the thigh.  I inspected, do not see any other pieces of cable in the area.  The wound was then copiously irrigated with saline solution and approximately 30 mL of 0.25% Marcaine with epinephrine was injected into the deep tissues, subcu tissues.  The fascia lata was then closed with interrupted #1 Vicryl. Subcu closed with interrupted 2-0 Vicryl and subcuticular running 4-0 Monocryl.  The incision was then cleaned and dried and a bulky sterile dressing applied.  He was then awakened and transported to recovery in stable condition.     Gaynelle Arabian, M.D.     FA/MEDQ  D:  05/20/2014  T:  05/21/2014  Job:  [id number]

## 2014-06-11 IMAGING — CR RIGHT HIP - COMPLETE 2+ VIEW
1 series · 2 of 2 positions shown · non-contrast
Comparison: none

REASON FOR EXAM: s/p THA
COMMENTS:   Bedside (portable):Y

PROCEDURE:     DXR - DXR HIP RIGHT COMPLETE  - August 21, 2012  [DATE]
RESULT:     This study was compared to previous study dated 12/08/2011.

[Series 1: ap · 0.17mm/px · 2 of 2 slices shown]
[im 1/2]
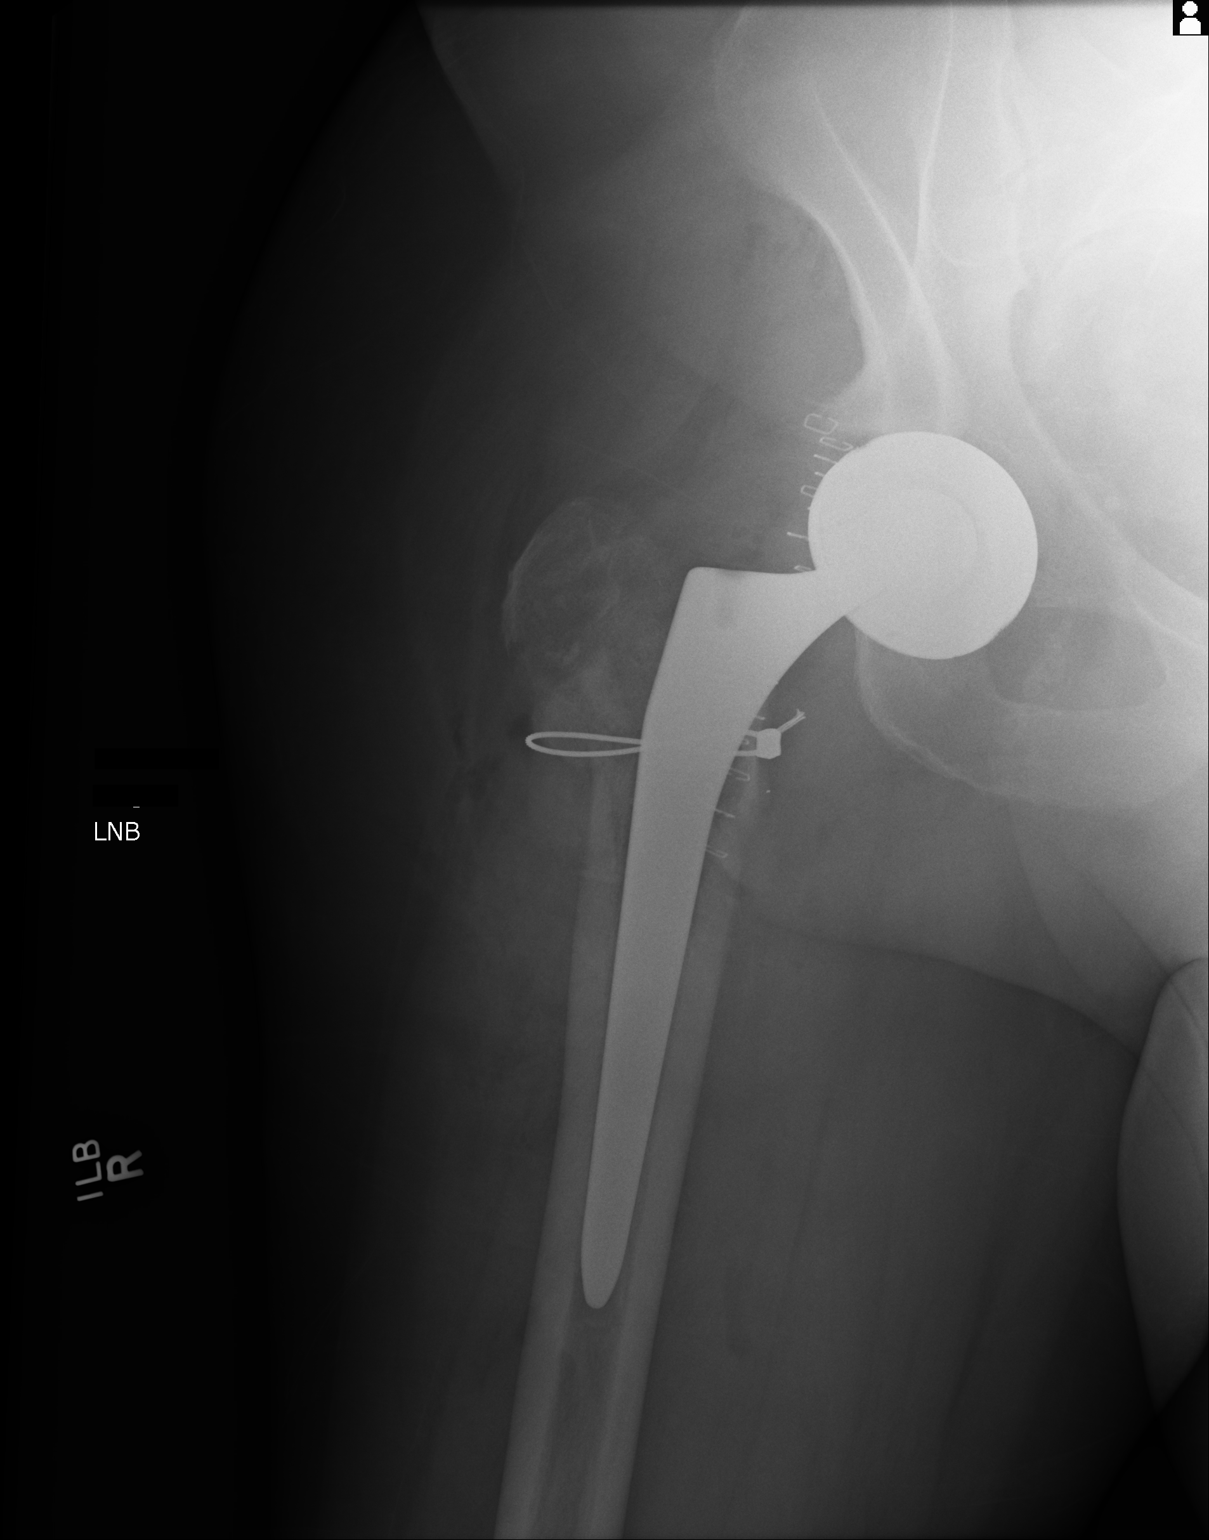
[im 2/2]
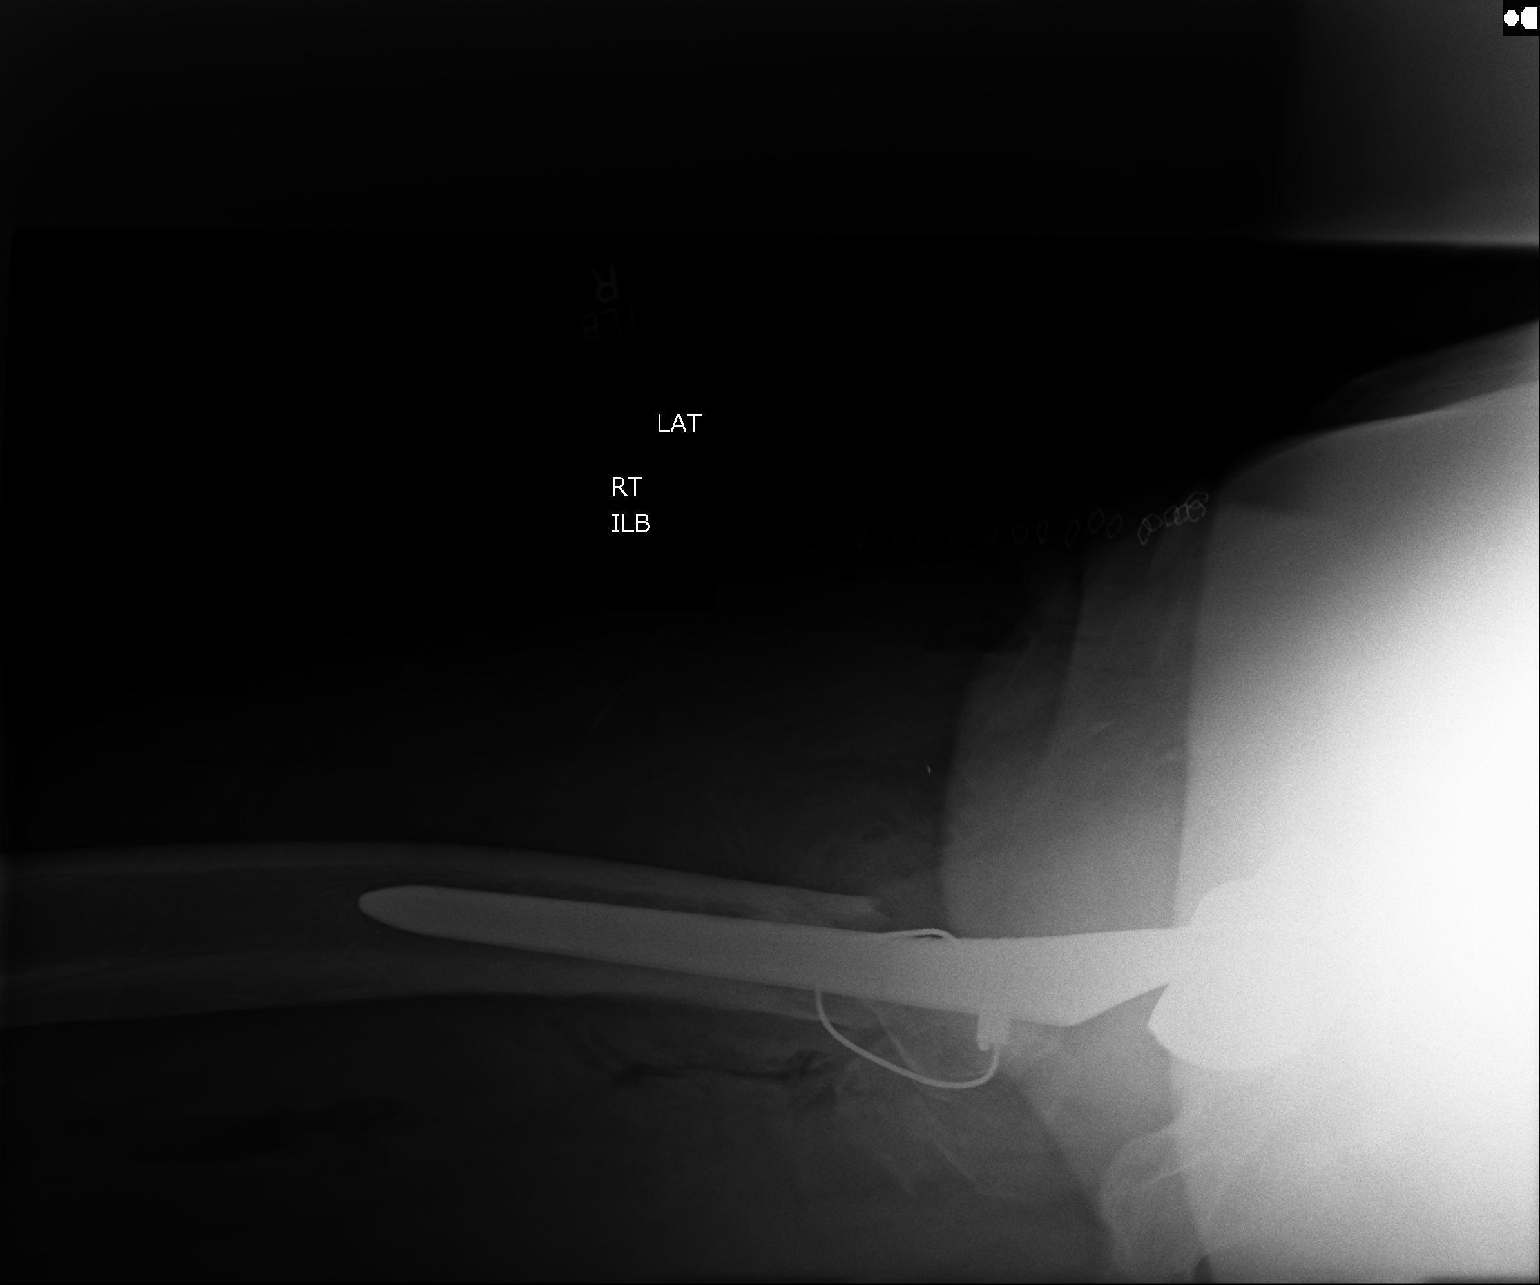

[2 of 2 positions shown; findings below may reference images not displayed]

FINDINGS: The patient is status post total right hip replacement. Hardware
appears intact without evidence of loosening or failure. There is
heterogeneity and lucency within the greater trochanter. Skin staples and
surgical drains are appreciated about the site.
IMPRESSION: The patient is status post total right hip replacement. The
remainder of the interpretation will be left to the performing physician.

## 2014-07-29 NOTE — Discharge Summary (Signed)
PATIENT NAME:  Paul Byrd, Paul Byrd MR#:  [id number] DATE OF BIRTH:  Nov 03, 1950  DATE OF ADMISSION:  12/08/2011 DATE OF DISCHARGE:  12/11/2011  ADMITTING DIAGNOSIS: Right hip osteoarthritis.   DISCHARGE DIAGNOSIS: Right hip osteoarthritis.   PROCEDURE: Right total hip replacement, anterior approach.   SURGEON: Laurene Footman, M.D.   ANESTHESIA: Spinal.   SPECIMENS: Femoral head fragments.   ESTIMATED BLOOD LOSS: 725 mL. 400 mL given back through Cell Saver.   IV FLUIDS: 4300.  IMPLANTS: Medacta acetabular shell, Versafit cup, DM 56-mm cementless implant with a #2 lateralized femoral stem, a size L 28-mm diameter +3.5 head with a 56-mm dual mobility liner.   COMPLICATIONS: None.   HISTORY: The patient had seen Dr. Drema Dallas.  Paul Byrd has had significant right hip pain. Paul Byrd had MR arthrogram of the hip showing degenerative changes as well as labral pathology. Paul Byrd has been taking oral narcotic with minimal relief, previously hydrocodone or Percocet. Paul Byrd has had trouble sleeping at night and difficulty doing his job. 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 Paul Byrd is unable to do so. Paul Byrd had significant pain in the groin, lateral aspect of the hip, and pain radiates to the medial thigh.   PHYSICAL EXAMINATION: LUNGS: Clear to auscultation. HEART: Regular rate and rhythm. HEENT: Normal. RIGHT HIP: On exam Paul Byrd has 10 degrees internal rotation with severe pain of the right hip. The patient's right hip has external rotation of 30 degrees with pain. Paul Byrd does not have any hip flexion contracture. Distally Paul Byrd is neurovascularly intact.   HOSPITAL COURSE: The patient was admitted to the hospital on 12/08/2011. Paul Byrd had surgery that same day and was brought to the orthopedic floor from the PAC-U in stable condition. On postoperative day one patient tolerated physical therapy well. His vital signs and lab work were checked and were stable throughout the patient's stay. Paul Byrd progressed  well with physical therapy. His lab work remained stable as well as his vital signs. On 12/11/2011, the patient had progressed well with physical therapy and was ready for discharge home with home physical therapy.   DISCHARGE INSTRUCTIONS:  1. The patient may gradually increase weight-bearing on the affected extremity.  2. Thigh-high TED hose on both legs and remove at bedtime, replace when arising the next morning.  3. Diet: The patient may resume a regular diet as tolerated.  4. Paul Byrd is to take Xarelto 10 mg every morning until finished.  5. Pain medications:  Tylenol 650 to 1000 mg every six hours as needed for pain. Oxycodone 5 to 10 mg every four hours as needed for pain.  6. Wound care: Apply ice pack to affected area. Do not get the dressing or bandage wet or dirty. Call [facility] orthopedics office if the dressing gets water under it. Leave the dressing on. Call [facility] orthopedics if any of the following occur: Bright red bleeding from the incision wound, fever above 101.5 degrees, redness, swelling, or drainage at the incision. Call [facility] orthopedics office if experiencing any leg pain, numbness or weakness in legs or bowel or bladder symptoms.   REFERRALS: Paul Byrd is referred to home physical therapy to his house. They should contact him within 48 hours of his return home.  FOLLOWUP:  The patient has a follow-up appointment at [facility] in two weeks. Paul Byrd needs to call [facility] orthopedics to schedule and confirm this appointment.   DISCHARGE MEDICATIONS:  1. Allopurinol 300 mg oral tablet,  1 tablet orally once a day in the morning.  2. Tylenol 650 mg, 2 tablets oral p.r.n. no more 4000 mg a day.  3. Additional medication: Gabapentin 300 mg 1 tablet by mouth at bedtime.  ____________________________ Duanne Guess, PA-C tcg:bjt D:  12/11/2011 07:49:24 ET          T: 12/13/2011 11:49:38 ET         JOB#: [id number]  Duanne Guess PA ELECTRONICALLY SIGNED  12/19/2011 14:15

## 2014-07-29 NOTE — Op Note (Signed)
PATIENT NAME:  Paul Byrd, Paul Byrd MR#:  [id number] DATE OF BIRTH:  1950/09/30  DATE OF PROCEDURE:  12/08/2011  PREOPERATIVE DIAGNOSIS: Right hip osteoarthritis.   POSTOPERATIVE DIAGNOSIS: Right hip osteoarthritis.   PROCEDURE: Right total hip replacement, anterior approach.  SURGEON: Laurene Footman, MD   ANESTHESIA: Spinal.   DESCRIPTION OF PROCEDURE: The patient was brought to the Operating Room and after adequate anesthesia was obtained the patient was placed on the operative table with the Medacta frame attachment, the left leg in a well legholder, the right leg in the traction boot, the C-arm was brought in and good visualization on x-ray was obtained. After prepping and draping using the barrier drape method, an anterior approach was made centered on the greater trochanter in line with the tensor fascia lata muscle. Skin and subcutaneous tissue was incised and electrocautery used for hemostasis. The tensor fascia was incised and the TFL retracted laterally, deep fascia incised, and the rectus fascia identified and incised. The circumflex femoral vessels were tied off and cut and the anterior muscle was elevated off the capsule and fat pad excised. A capsulotomy was then carried out and femoral neck cut made. At this point, there was a great deal of difficulty getting the head removed. It had a very thick ligamentum tere and would not come out and had to be sectioned. It took some time along with reaming to get this entirely out. There were degenerative changes present. The acetabulum was then sequentially reamed up to 56 mm, there was good bleeding bone, and the 56 mm cup was tight on pullout test with the trial. The 56 cup was then impacted into place. Next, attention was turned to the femur. This was difficult secondary to the patient's large muscle mass, but after getting adequate visualization of the proximal femur sequential broaching was carried up to a #2 and #2 trials the #2 was felt to be  the appropriate size. With the #2 stem, different trials were used and then the femoral implant was placed. With the #0 trial, there still was some laxity to pistoning and it seemed like it was slightly short. X-ray pictures were taken. The cup, when inserting the final component, did antivert some and this was corrected with the hip dislocated once again and the cup was impacted. Once again it did appear stable to pull-out testing. The final components were then inserted and the hip was stable through range of motion. The wound was then thoroughly irrigated. 30 mL 0.25% Sensorcaine with epinephrine and 10 mg of morphine along with saline  were injected in the periarticular tissues for analgesia. The tensor fascia was repaired using a running heavy quill suture followed by subcutaneous drain, 2-0 quill subcutaneously, and skin staples. Xeroform, 4 x 4's, ABD, and tape were applied. The patient was sent to the recovery room in stable condition.   SPECIMENS: Femoral head fragments.  ESTIMATED BLOOD LOSS: 725 mL, 400 mL given back through Cell Saver.   IV FLUIDS: 4300.  IMPLANTS: Medacta acetabular shell, Versafit cup DM 56 mm cementless implant with a #2 lateralized femoral stem, a size L 28 mm diameter +3.5 head with a 56 mm deep dual mobility liner.   COMPLICATIONS: None.  ____________________________ Laurene Footman, MD mjm:slb D: 12/08/2011 19:18:11 ET T: 12/09/2011 08:11:51 ET JOB#: [id number]  cc: Laurene Footman, MD, <Dictator> Laurene Footman MD ELECTRONICALLY SIGNED 12/09/2011 8:40

## 2014-08-01 NOTE — Op Note (Signed)
PATIENT NAME:  Paul Byrd, Paul Byrd MR#:  [id number] DATE OF BIRTH:  1950-10-24  DATE OF PROCEDURE:  08/21/2012  PREOPERATIVE DIAGNOSIS: Painful total hip.   POSTOPERATIVE DIAGNOSIS: Painful total hip.   PROCEDURE: Revision total hip replacement, femoral and acetabular components.   SURGEON: Laurene Footman, MD  ASSISTANT:  Rachelle Hora, PA-C.   DESCRIPTION OF PROCEDURE: The patient was brought to the operating room and placed on the operative table with the Mitek attachment. The right leg was placed in the traction boot, left leg in a well leg holder after initial x-ray taken for comparison of leg lengths. The hip was prepped and draped used the usual sterile fat in the usual sterile fashion, appropriate patient identification and timeout procedures were completed. The prior incision was utilized with slight extension distally with Cell Saver being used during the procedure. After going down through the skin, the tensor muscle was identified and the muscle fascia incised, the muscle retracted laterally. The rectus muscle then identified and retracted medially and acute anterior capsule exposed. Anterior capsulotomy was carried out with a long flap, laterally based and deep culture obtained. Inspection revealed some synovitis within the joint. The synovium was debrided and capsule released to get better exposure of the acetabulum, getting up to the acetabular component. The hip was dislocated when it was possible of soft tissues and the prior bipolar head was removed without difficulty. Next, the proximal femur was exposed removing scar tissue around the periphery and a combination of thin flexible osteotomes and small osteotomes were used to try to break the soft tissue attachment to the bone with prior bone scan suggesting loosening. However, the assembly did not come out easily. A proximal split was made at the level of the lesser trochanter after predrilling for subsequent fixation with the screw.  However, after splitting this and elevating the bone, a small fragment did break of medially, so  the screw fixation could not be used and subsequently a wire fixation. It takes several hours to get the stem out, but after, creating an osteotomy and elevating the anterior and posterior bone flaps, the distal portion of the stem, which was attached was separated from the metal implant with an osteotome and then the stem came out easily. The acetabulum was then removed with the use of an explant cup extractor and minimal bone loss occurred. The cup was then deepened getting it more medial as there was some overhang and a 56 mm cup was reinserted with appropriate anteversion and lateral opening. Following this, the canal was broached with a wire passed around the lesser trochanter and greater trochanter fragments with trials placed, with a #2 Quadra revision stem. It was a little short, so the #3 stem was placed and this gave appropriate leg length and appeared quite tight distally with good distal fill and this was chosen as the final implant, impacted down the canal and then the cerclage wire at the level of the lesser trochanter was tightened. There did appear to be rotational stability proximally at this point. Trials were made off of this final stem and a short head 28 mm with a 56 mm dual mobility head were assembled. The hip was reduced and was stable through a range of motion. Initially, there was some soft tissue interposed and the sutures removed medially. The  reduction appeared concentric on C-arm and checking on the x-rays, there appeared to be some improvement in leg lengths as he was slightly short, preop. The wound was irrigated with  pulsatile lavage prior to reduction. At this point, the deep fascia was repaired using a heavy quill suture, 2-0 quill subcutaneously and skin staples.   ESTIMATED BLOOD LOSS: 2500 mL mostly from bone bleeding from the canal with 1000 mL given back through Cell Saver.    SPECIMEN: Of culture of the joint fluid.   OPERATIVE FINDINGS: There was a loose proximal stem and tight distal stem.   IMPLANTS: Meditech size 3 Quadra R stem, 56 mm Versafit cup with a 28 mm S-head and 56 mm dual mobility head, 1 Dall-Miles cable for fixation of osteotomy . COMPLICATIONS: None.   CONDITION: To recovery room, stable.   PATIENT NAME: Paul Byrd    ____________________________ Laurene Footman, MD mjm:cc D: 08/21/2012 17:25:05 ET T: 08/21/2012 18:55:46 ET JOB#: [id number]  cc: Laurene Footman, MD, <Dictator> Laurene Footman MD ELECTRONICALLY SIGNED 08/21/2012 19:47

## 2014-08-01 NOTE — Discharge Summary (Signed)
PATIENT NAME:  Paul Byrd, Paul Byrd MR#:  [id number] DATE OF BIRTH:  01-06-1951  DATE OF ADMISSION:  08/21/2012 DATE OF DISCHARGE:  08/24/2012   ADMITTING DIAGNOSIS: Painful total hip on the right.   DISCHARGE DIAGNOSIS:  Painful total hip on the right.  OPERATION: On 08/21/2012, the patient had a revision of total hip replacement involving the femoral and acetabular components.   SURGEON:  Hessie Knows, M.D.   ASSISTANT: Rachelle Hora PA-C.   ESTIMATED BLOOD LOSS: 2500 mL and 1000 mL was given back by Cell Saver.   IMPLANTS FROM:  MediTech size 3, Quadra 4-stem, 56 mm Versa cup with 28 mm S head and 56 mm dual mobility head, 1 Dall-Miles cable for fixation of osteotomy.   COMPLICATIONS: None.   DISPOSITION:  The patient was stabilized, brought to the recovery room, and brought down to the orthopedic floor for pain control and physical therapy.   HISTORY OF PRESENT ILLNESS: The patient is a 64 year old male who presented for evaluation of the right hip. The patient has been having pain from his total hip replacement done 12/08/2011. The patient has had a bone scan revealing proximal stem loosening.  He has pain though with activities and was sitting.   PHYSICAL EXAMINATION: GENERAL: Well-developed, well-nourished male with some difficulty and discomfort with ambulation.  LUNGS: Clear to auscultation.  HEART: Regular rate and rhythm.   MUSCULOSKELETAL: In regard to the right hip, the patient does have full range of motion but pain with hip rotation. The patient has mild tenderness along the femoral shaft.   HOSPITAL COURSE: After initial admission on 08/21/2012, the patient was brought to the orthopedic floor where he was treated for physical therapy. The patient ambulated from bed to chair and progressed up to be ambulating 60 feet on discharge including stairs. The patient did have a hemoglobin initially after surgery on postoperative day #1 of 11.0, which dropped down to 9.4 on  postoperative day #2 and down to 8.3 on postoperative day #3. The patient did have some dizziness initially, but seemed to diminish and his vital seemed to stabilize. The patient was ready to go home with home health physical therapy. The patient was sent home with iron tablets for his hemoglobin.   CONDITION AT DISCHARGE: Stable.   DISPOSITION: The patient was sent home with home health physical therapy.   DISCHARGE INSTRUCTIONS:  The patient is to follow-up with [facility] orthopedics in 2 weeks. The patient is to use anterior hip precautions. The patient will use thigh-high TED hose on both legs, removed at nighttime.  The patient is toe-touch weight-bear on the right leg. The patient will resume regular diet. The patient will keep an ice pack on the affected leg and try not to get his dressing dirty and keep it dry.  The patient will leave his dressing on. The patient will call the clinic if there is any bright red bleeding, calf pain, bowel or bladder difficulty.   DISCHARGE MEDICATIONS:  Resume home medication. The patient will use oxycodone 5 mg 1 tablet q.4 hours as needed for severe pain. The patient will use Xarelto 10 mg daily for 11 days and ferrous fumarate 1 capsule p.o. twice a day.    ____________________________ Lenna Sciara. Reche Dixon, [place] jtm:ct D: 08/24/2012 07:37:05 ET T: 08/24/2012 07:54:27 ET JOB#: [id number]  cc: J. Reche Dixon, [place], <Dictator> Laurene Footman, [place] PA ELECTRONICALLY SIGNED 08/31/2012 7:19

## 2014-12-20 IMAGING — CR DG HIP (WITH OR WITHOUT PELVIS) 2-3V*L*
3 series · 3 of 3 positions shown · non-contrast
Comparison: None.

CLINICAL DATA: Osteoarthritis

EXAM:
LEFT HIP - COMPLETE 2+ VIEW

[t pelvis a.p.]
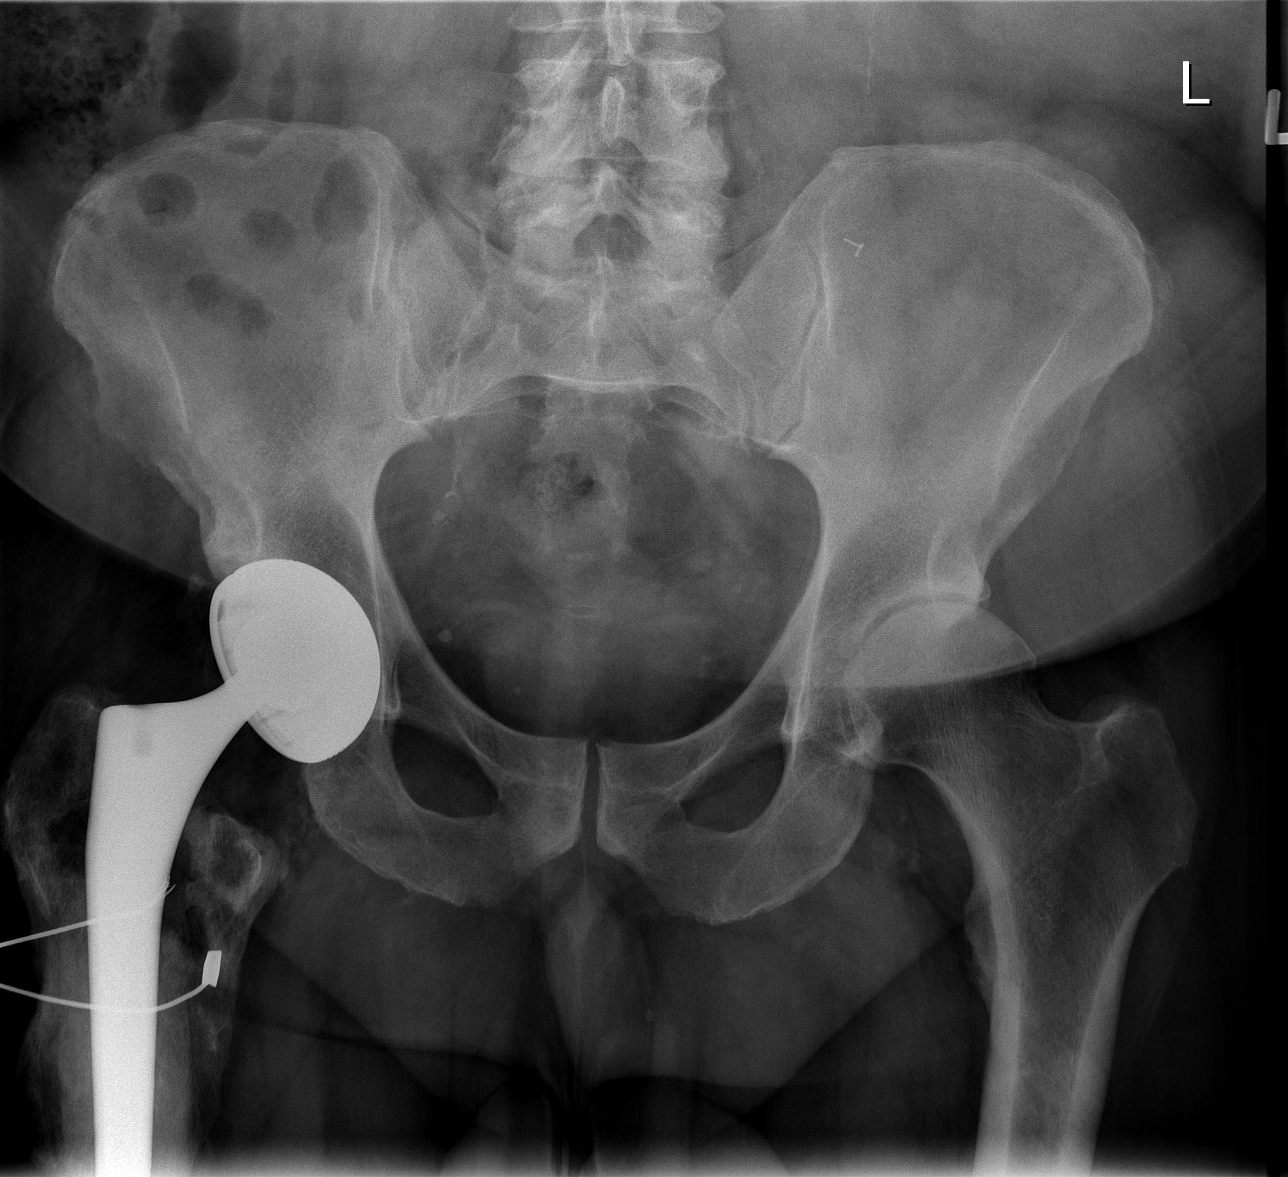

[t hip ap left]
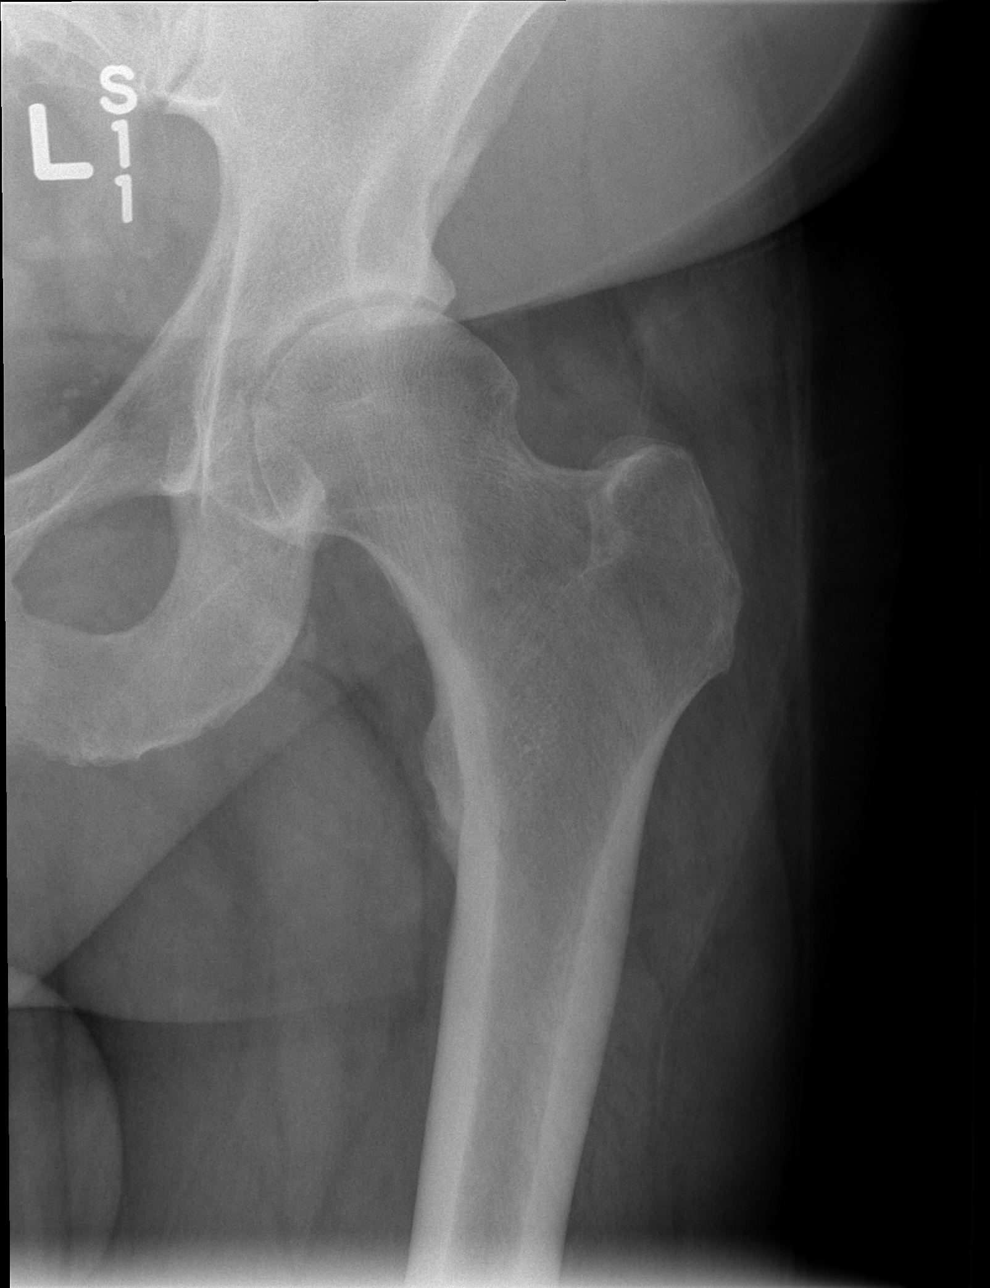

[t hip frog leg left]
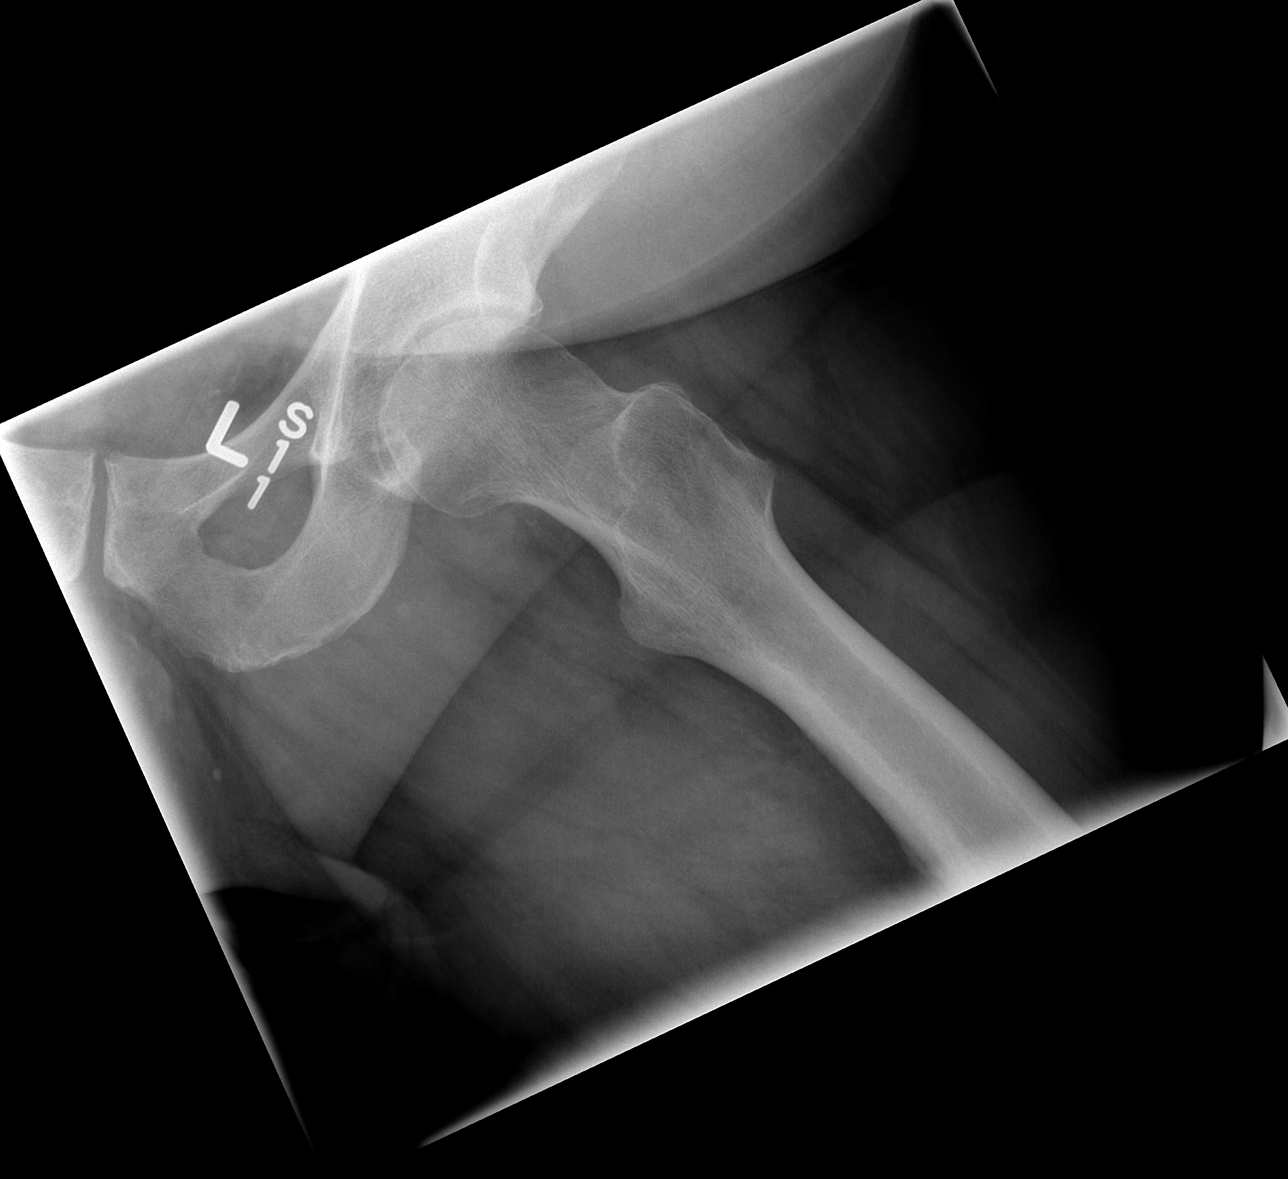

[3 of 3 positions shown; findings below may reference images not displayed]

FINDINGS: Frontal pelvis as well as frontal and lateral left hip images were
obtained. Patient is status post right total hip replacement.

There is moderate narrowing of the left hip joint. No erosive
change. No acute fracture or dislocation present. Atherosclerotic
change is noted in the pelvis.
IMPRESSION: Moderate narrowing of the left hip joint consistent with a degree of
osteoarthritis. No erosive change. Status post total hip replacement
on the right. No acute fracture or dislocation.

## 2014-12-25 IMAGING — CR DG PORTABLE PELVIS
1 series · 1 of 1 positions shown · non-contrast
Comparison: 03/01/2013

CLINICAL DATA: Postop from left hip replacement.

EXAM:
PORTABLE PELVIS 1-2 VIEWS

[AP]
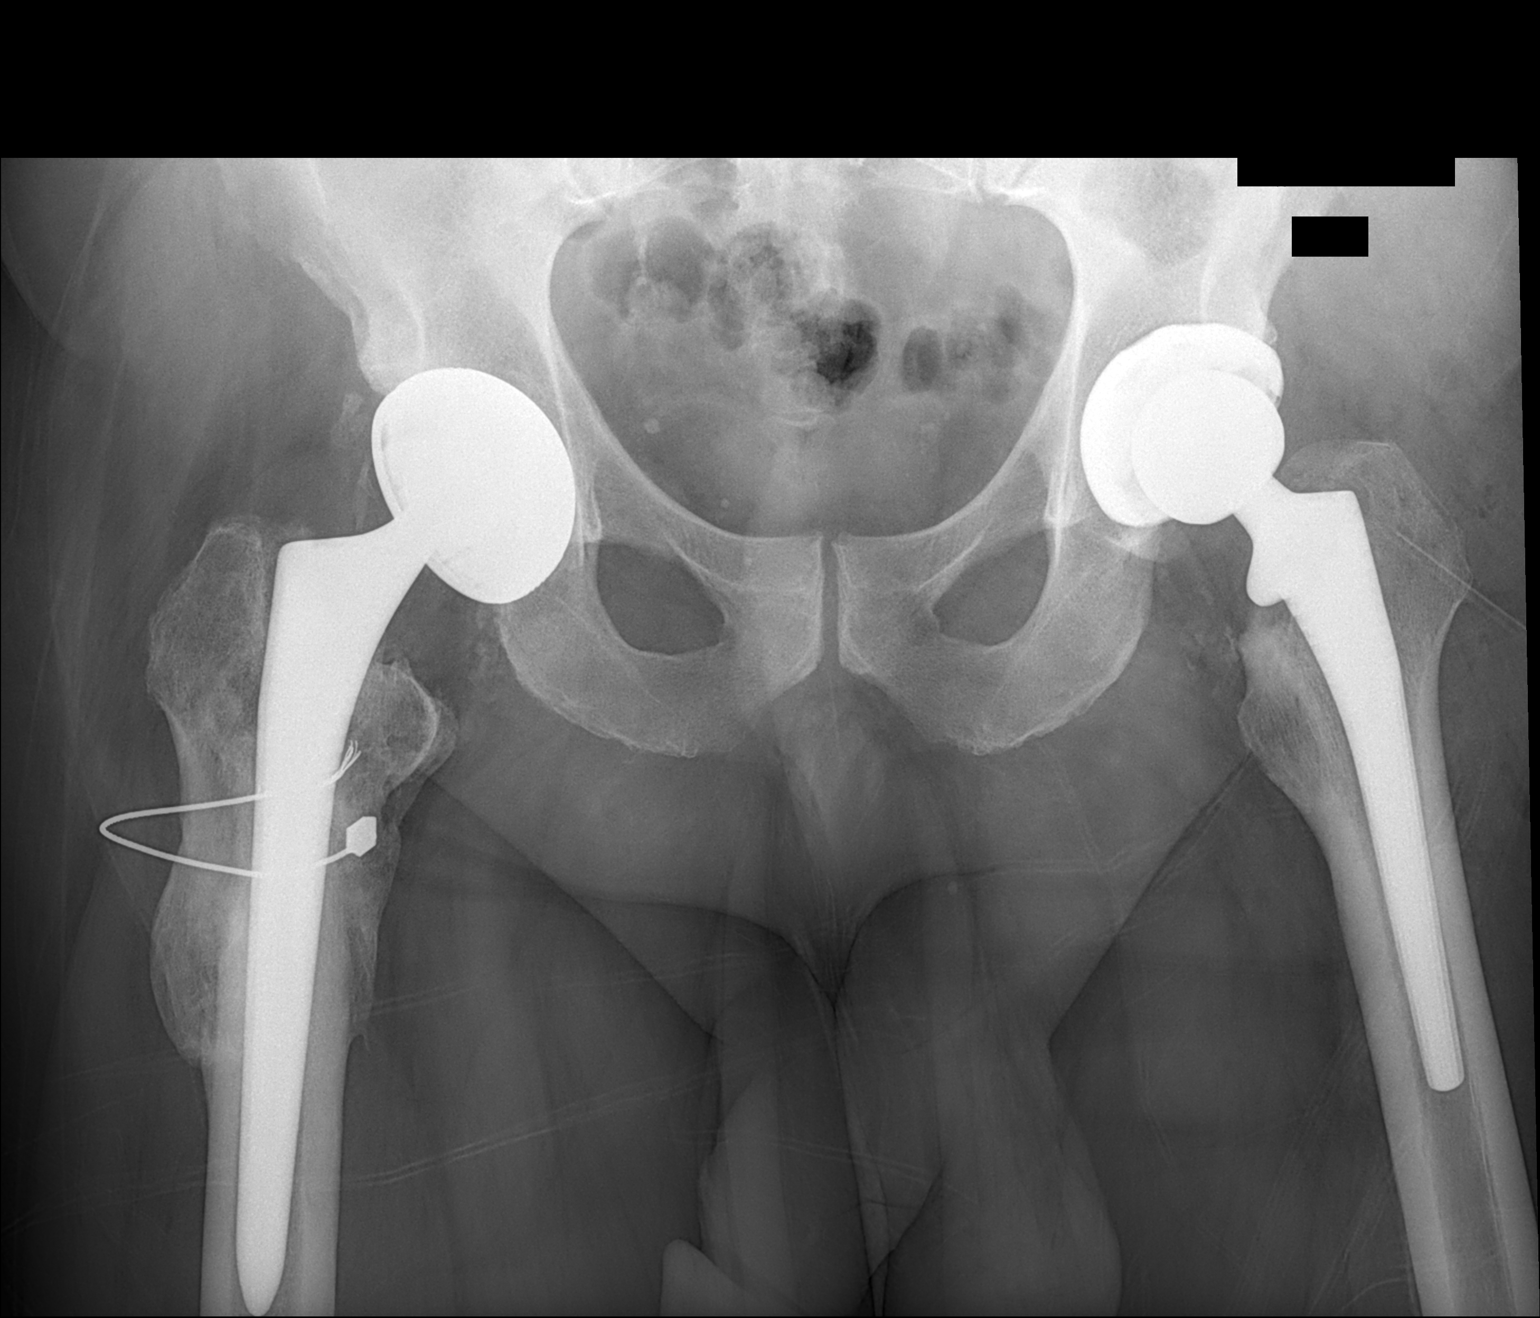

[1 of 1 positions shown; findings below may reference images not displayed]

FINDINGS: Bipolar left hip prosthesis seen in appropriate position. No
evidence of fracture or dislocation. Old right hip prosthesis
remains stable in appearance.
IMPRESSION: Expected postoperative appearance of left hip prosthesis. No
evidence of fracture or dislocation.

## 2015-04-19 ENCOUNTER — Encounter: Payer: Self-pay | Admitting: Gynecology

## 2015-04-19 ENCOUNTER — Ambulatory Visit
Admission: EM | Admit: 2015-04-19 | Discharge: 2015-04-19 | Disposition: A | Discharge status: Home / Self Care | Payer: BC Managed Care – PPO | Attending: Family Medicine | Admitting: Family Medicine

## 2015-04-19 DIAGNOSIS — H6593 Unspecified nonsuppurative otitis media, bilateral: Secondary | ICD-10-CM | POA: Diagnosis not present

## 2015-04-19 DIAGNOSIS — H6590 Unspecified nonsuppurative otitis media, unspecified ear: Secondary | ICD-10-CM

## 2015-04-19 DIAGNOSIS — J069 Acute upper respiratory infection, unspecified: Secondary | ICD-10-CM | POA: Diagnosis not present

## 2015-04-19 DIAGNOSIS — J209 Acute bronchitis, unspecified: Secondary | ICD-10-CM | POA: Diagnosis not present

## 2015-04-19 DIAGNOSIS — J329 Chronic sinusitis, unspecified: Secondary | ICD-10-CM

## 2015-04-19 LAB — RAPID STREP SCREEN (MED CTR MEBANE ONLY): STREPTOCOCCUS, GROUP A SCREEN (DIRECT): NEGATIVE

## 2015-04-19 MED ORDER — IPRATROPIUM-ALBUTEROL 0.5-2.5 (3) MG/3ML IN SOLN
3.0000 mL | Freq: Four times a day (QID) | RESPIRATORY_TRACT | Status: DC
  Administered 2015-04-19: 3 mL via RESPIRATORY_TRACT

## 2015-04-19 MED ORDER — DOXYCYCLINE HYCLATE 100 MG PO CAPS: 100.0000 mg | ORAL_CAPSULE | Freq: Two times a day (BID) | ORAL | Status: DC

## 2015-04-19 MED ORDER — BENZONATATE 200 MG PO CAPS: 200.0000 mg | ORAL_CAPSULE | Freq: Three times a day (TID) | ORAL | Status: DC | PRN

## 2015-04-19 MED ORDER — FLUTICASONE PROPIONATE 50 MCG/ACT NA SUSP: 1.0000 | Freq: Two times a day (BID) | NASAL | Status: DC

## 2015-04-19 MED ORDER — SALINE SPRAY 0.65 % NA SOLN
2.0000 | NASAL | Status: DC
Start: 2015-04-19 — End: 2022-02-17

## 2015-04-19 NOTE — Discharge Instructions (Signed)
Acute Bronchitis °Bronchitis is inflammation of the airways that extend from the windpipe into the lungs (bronchi). The inflammation often causes mucus to develop. This leads to a cough, which is the most common symptom of bronchitis.  °In acute bronchitis, the condition usually develops suddenly and goes away over time, usually in a couple weeks. Smoking, allergies, and asthma can make bronchitis worse. Repeated episodes of bronchitis may cause further lung problems.  °CAUSES °Acute bronchitis is most often caused by the same virus that causes a cold. The virus can spread from person to person (contagious) through coughing, sneezing, and touching contaminated objects. °SIGNS AND SYMPTOMS  °· Cough.   °· Fever.   °· Coughing up mucus.   °· Body aches.   °· Chest congestion.   °· Chills.   °· Shortness of breath.   °· Sore throat.   °DIAGNOSIS  °Acute bronchitis is usually diagnosed through a physical exam. Your health care provider will also ask you questions about your medical history. Tests, such as chest X-rays, are sometimes done to rule out other conditions.  °TREATMENT  °Acute bronchitis usually goes away in a couple weeks. Oftentimes, no medical treatment is necessary. Medicines are sometimes given for relief of fever or cough. Antibiotic medicines are usually not needed but may be prescribed in certain situations. In some cases, an inhaler may be recommended to help reduce shortness of breath and control the cough. A cool mist vaporizer may also be used to help thin bronchial secretions and make it easier to clear the chest.  °HOME CARE INSTRUCTIONS °· Get plenty of rest.   °· Drink enough fluids to keep your urine clear or pale yellow (unless you have a medical condition that requires fluid restriction). Increasing fluids may help thin your respiratory secretions (sputum) and reduce chest congestion, and it will prevent dehydration.   °· Take medicines only as directed by your health care provider. °· If  you were prescribed an antibiotic medicine, finish it all even if you start to feel better. °· Avoid smoking and secondhand smoke. Exposure to cigarette smoke or irritating chemicals will make bronchitis worse. If you are a smoker, consider using nicotine gum or skin patches to help control withdrawal symptoms. Quitting smoking will help your lungs heal faster.   °· Reduce the chances of another bout of acute bronchitis by washing your hands frequently, avoiding people with cold symptoms, and trying not to touch your hands to your mouth, nose, or eyes.   °· Keep all follow-up visits as directed by your health care provider.   °SEEK MEDICAL CARE IF: °Your symptoms do not improve after 1 week of treatment.  °SEEK IMMEDIATE MEDICAL CARE IF: °· You develop an increased fever or chills.   °· You have chest pain.   °· You have severe shortness of breath. °· You have bloody sputum.   °· You develop dehydration. °· You faint or repeatedly feel like you are going to pass out. °· You develop repeated vomiting. °· You develop a severe headache. °MAKE SURE YOU:  °· Understand these instructions. °· Will watch your condition. °· Will get help right away if you are not doing well or get worse. °  °This information is not intended to replace advice given to you by your health care provider. Make sure you discuss any questions you have with your health care provider. °  °Document Released: 05/05/2004 Document Revised: 04/18/2014 Document Reviewed: 09/18/2012 °Elsevier Interactive Patient Education ©2016 [employer] °Sinusitis, Adult °Sinusitis is redness, soreness, and inflammation of the paranasal sinuses. Paranasal sinuses are air pockets within the   bones of your face. They are located beneath your eyes, in the middle of your forehead, and above your eyes. In healthy paranasal sinuses, mucus is able to drain out, and air is able to circulate through them by way of your nose. However, when your paranasal sinuses are inflamed,  mucus and air can become trapped. This can allow bacteria and other germs to grow and cause infection. °Sinusitis can develop quickly and last only a short time (acute) or continue over a long period (chronic). Sinusitis that lasts for more than 12 weeks is considered chronic. °CAUSES °Causes of sinusitis include: °· Allergies. °· Structural abnormalities, such as displacement of the cartilage that separates your nostrils (deviated septum), which can decrease the air flow through your nose and sinuses and affect sinus drainage. °· Functional abnormalities, such as when the small hairs (cilia) that line your sinuses and help remove mucus do not work properly or are not present. °SIGNS AND SYMPTOMS °Symptoms of acute and chronic sinusitis are the same. The primary symptoms are pain and pressure around the affected sinuses. Other symptoms include: °· Upper toothache. °· Earache. °· Headache. °· Bad breath. °· Decreased sense of smell and taste. °· A cough, which worsens when you are lying flat. °· Fatigue. °· Fever. °· Thick drainage from your nose, which often is green and may contain pus (purulent). °· Swelling and warmth over the affected sinuses. °DIAGNOSIS °Your health care provider will perform a physical exam. During your exam, your health care provider may perform any of the following to help determine if you have acute sinusitis or chronic sinusitis: °· Look in your nose for signs of abnormal growths in your nostrils (nasal polyps). °· Tap over the affected sinus to check for signs of infection. °· View the inside of your sinuses using an imaging device that has a light attached (endoscope). °If your health care provider suspects that you have chronic sinusitis, one or more of the following tests may be recommended: °· Allergy tests. °· Nasal culture. A sample of mucus is taken from your nose, sent to a lab, and screened for bacteria. °· Nasal cytology. A sample of mucus is taken from your nose and examined by  your health care provider to determine if your sinusitis is related to an allergy. °TREATMENT °Most cases of acute sinusitis are related to a viral infection and will resolve on their own within 10 days. Sometimes, medicines are prescribed to help relieve symptoms of both acute and chronic sinusitis. These may include pain medicines, decongestants, nasal steroid sprays, or saline sprays. °However, for sinusitis related to a bacterial infection, your health care provider will prescribe antibiotic medicines. These are medicines that will help kill the bacteria causing the infection. °Rarely, sinusitis is caused by a fungal infection. In these cases, your health care provider will prescribe antifungal medicine. °For some cases of chronic sinusitis, surgery is needed. Generally, these are cases in which sinusitis recurs more than 3 times per year, despite other treatments. °HOME CARE INSTRUCTIONS °· Drink plenty of water. Water helps thin the mucus so your sinuses can drain more easily. °· Use a humidifier. °· Inhale steam 3-4 times a day (for example, sit in the bathroom with the shower running). °· Apply a warm, moist washcloth to your face 3-4 times a day, or as directed by your health care provider. °· Use saline nasal sprays to help moisten and clean your sinuses. °· Take medicines only as directed by your health care provider. °· If   you were prescribed either an antibiotic or antifungal medicine, finish it all even if you start to feel better. SEEK IMMEDIATE MEDICAL CARE IF:  You have increasing pain or severe headaches.  You have nausea, vomiting, or drowsiness.  You have swelling around your face.  You have vision problems.  You have a stiff neck.  You have difficulty breathing.   This information is not intended to replace advice given to you by your health care provider. Make sure you discuss any questions you have with your health care provider.   Document Released: 03/28/2005 Document  Revised: 04/18/2014 Document Reviewed: 04/12/2011 Elsevier Interactive Patient Education 2016 [employer] Pharyngitis Pharyngitis is redness, pain, and swelling (inflammation) of your pharynx.  CAUSES  Pharyngitis is usually caused by infection. Most of the time, these infections are from viruses (viral) and are part of a cold. However, sometimes pharyngitis is caused by bacteria (bacterial). Pharyngitis can also be caused by allergies. Viral pharyngitis may be spread from person to person by coughing, sneezing, and personal items or utensils (cups, forks, spoons, toothbrushes). Bacterial pharyngitis may be spread from person to person by more intimate contact, such as kissing.  SIGNS AND SYMPTOMS  Symptoms of pharyngitis include:   Sore throat.   Tiredness (fatigue).   Low-grade fever.   Headache.  Joint pain and muscle aches.  Skin rashes.  Swollen lymph nodes.  Plaque-like film on throat or tonsils (often seen with bacterial pharyngitis). DIAGNOSIS  Your health care provider will ask you questions about your illness and your symptoms. Your medical history, along with a physical exam, is often all that is needed to diagnose pharyngitis. Sometimes, a rapid strep test is done. Other lab tests may also be done, depending on the suspected cause.  TREATMENT  Viral pharyngitis will usually get better in 3-4 days without the use of medicine. Bacterial pharyngitis is treated with medicines that kill germs (antibiotics).  HOME CARE INSTRUCTIONS   Drink enough water and fluids to keep your urine clear or pale yellow.   Only take over-the-counter or prescription medicines as directed by your health care provider:   If you are prescribed antibiotics, make sure you finish them even if you start to feel better.   Do not take aspirin.   Get lots of rest.   Gargle with 8 oz of salt water ( tsp of salt per 1 qt of water) as often as every 1-2 hours to soothe your throat.    Throat lozenges (if you are not at risk for choking) or sprays may be used to soothe your throat. SEEK MEDICAL CARE IF:   You have large, tender lumps in your neck.  You have a rash.  You cough up green, yellow-brown, or bloody spit. SEEK IMMEDIATE MEDICAL CARE IF:   Your neck becomes stiff.  You drool or are unable to swallow liquids.  You vomit or are unable to keep medicines or liquids down.  You have severe pain that does not go away with the use of recommended medicines.  You have trouble breathing (not caused by a stuffy nose). MAKE SURE YOU:   Understand these instructions.  Will watch your condition.  Will get help right away if you are not doing well or get worse.   This information is not intended to replace advice given to you by your health care provider. Make sure you discuss any questions you have with your health care provider.   Document Released: 03/28/2005 Document Revised: 01/16/2013 Document Reviewed: 12/03/2012  Elsevier Interactive Patient Education 2016 [employer]. Otitis Media With Effusion Otitis media with effusion is the presence of fluid in the middle ear. This is a common problem in children, which often follows ear infections. It may be present for weeks or longer after the infection. Unlike an acute ear infection, otitis media with effusion refers only to fluid behind the ear drum and not infection. Children with repeated ear and sinus infections and allergy problems are the most likely to get otitis media with effusion. CAUSES  The most frequent cause of the fluid buildup is dysfunction of the eustachian tubes. These are the tubes that drain fluid in the ears to the back of the nose (nasopharynx). SYMPTOMS   The main symptom of this condition is hearing loss. As a result, you or your child may:  Listen to the TV at a loud volume.  Not respond to questions.  Ask "what" often when spoken to.  Mistake or confuse one sound or word for  another.  There may be a sensation of fullness or pressure but usually not pain. DIAGNOSIS   Your health care provider will diagnose this condition by examining you or your child's ears.  Your health care provider may test the pressure in you or your child's ear with a tympanometer.  A hearing test may be conducted if the problem persists. TREATMENT   Treatment depends on the duration and the effects of the effusion.  Antibiotics, decongestants, nose drops, and cortisone-type drugs (tablets or nasal spray) may not be helpful.  Children with persistent ear effusions may have delayed language or behavioral problems. Children at risk for developmental delays in hearing, learning, and speech may require referral to a specialist earlier than children not at risk.  You or your child's health care provider may suggest a referral to an ear, nose, and throat surgeon for treatment. The following may help restore normal hearing:  Drainage of fluid.  Placement of ear tubes (tympanostomy tubes).  Removal of adenoids (adenoidectomy). HOME CARE INSTRUCTIONS   Avoid secondhand smoke.  Infants who are breastfed are less likely to have this condition.  Avoid feeding infants while they are lying flat.  Avoid known environmental allergens.  Avoid people who are sick. SEEK MEDICAL CARE IF:   Hearing is not better in 3 months.  Hearing is worse.  Ear pain.  Drainage from the ear.  Dizziness. MAKE SURE YOU:   Understand these instructions.  Will watch your condition.  Will get help right away if you are not doing well or get worse.   This information is not intended to replace advice given to you by your health care provider. Make sure you discuss any questions you have with your health care provider.   Document Released: 05/05/2004 Document Revised: 04/18/2014 Document Reviewed: 10/23/2012 Elsevier Interactive Patient Education [employer].

## 2015-04-19 NOTE — ED Notes (Signed)
Patient c/o congestion / low grade fever/ cough / sore throat/ chills x 4 days.

## 2015-04-19 NOTE — ED Provider Notes (Signed)
CSN: [id number]     Arrival date & time 04/19/15  1101 History   First MD Initiated Contact with Patient 04/19/15 1118     Chief Complaint  Patient presents with  . URI   (Consider location/radiation/quality/duration/timing/severity/associated sxs/prior Treatment) HPI Comments: Married caucasian male here for evaluation of congestion, chills, shakes, sweats, feeling cold x 4 days. Wheezing and shortness of breath x 3 days Has tried dayquil, nyquil, alkaseltzer night time without much change in symptoms  FHx F-diabetes and stroke  The history is provided by the patient and the spouse.    Past Medical History  Diagnosis Date  . Chronic kidney disease     nephrectomy left kidney-cancer  . Arthritis   . Cancer ([place]) 2001    kidney-left(surgical removal only)  . Headache(784.0)     rare now   Past Surgical History  Procedure Laterality Date  . Joint replacement      right hip x 2  . Tonsillectomy    . Nephrectomy  2001    left kidney for cancer  . Scar tissue removal      right chin  . Shoulder surgery      right x 2, left x 1  . 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 Hardware removal Right 05/20/2014    Procedure: RIGHT HIP HARDWARE REMOVAL;  Surgeon: [name], MD;  Location: [facility];  Service: Orthopedics;  Laterality: Right;   History reviewed. No pertinent family history. Social History  Substance Use Topics  . Smoking status: Former Smoker    Quit date: 05/27/1999  . Smokeless tobacco: None  . Alcohol Use: Yes     Comment: occassionally    Review of Systems  Constitutional: Positive for chills and diaphoresis. Negative for fever, activity change, appetite change, fatigue and unexpected weight change.  HENT: Positive for congestion, postnasal drip, rhinorrhea and sinus pressure. Negative for dental problem,  drooling, ear discharge, ear pain, facial swelling, hearing loss, mouth sores, nosebleeds, sneezing, sore throat, tinnitus, trouble swallowing and voice change.   Eyes: Negative for photophobia, pain, discharge, redness, itching and visual disturbance.  Respiratory: Positive for cough. Negative for choking, chest tightness, shortness of breath, wheezing and stridor.   Cardiovascular: Negative for chest pain, palpitations and leg swelling.  Gastrointestinal: Negative for nausea, vomiting, abdominal pain, diarrhea, constipation, blood in stool and abdominal distention.  Endocrine: Negative for cold intolerance and heat intolerance.  Genitourinary: Negative for dysuria.  Musculoskeletal: Negative for myalgias, back pain, joint swelling, arthralgias, gait problem, neck pain and neck stiffness.  Skin: Negative for color change, pallor, rash and wound.  Allergic/Immunologic: Positive for environmental allergies. Negative for food allergies and immunocompromised state.  Neurological: Negative for dizziness, tremors, seizures, syncope, facial asymmetry, speech difficulty, weakness, light-headedness, numbness and headaches.  Hematological: Negative for adenopathy. Does not bruise/bleed easily.  Psychiatric/Behavioral: Negative for behavioral problems, confusion, sleep disturbance and agitation.    Allergies  Review of patient's allergies indicates no known allergies.  Home Medications   Prior to Admission medications   Medication Sig Start Date End Date Taking? Authorizing Provider  acetaminophen (TYLENOL) 500 MG tablet Take 1,000 mg by mouth every 6 (six) hours as needed for headache.   Yes Historical Provider, MD  allopurinol (ZYLOPRIM) 300 MG tablet Take 300 mg by mouth daily with breakfast.   Yes Historical Provider, MD  tamsulosin (FLOMAX) 0.4 MG CAPS capsule Take 0.4 mg by mouth daily.   Yes Historical Provider, MD  benzonatate (TESSALON) 200 MG capsule Take 1 capsule (200 mg total) by mouth 3  (three) times daily as needed for cough. 04/19/15   Olen Cordial, NP  doxycycline (VIBRAMYCIN) 100 MG capsule Take 1 capsule (100 mg total) by mouth 2 (two) times daily. 04/19/15   Olen Cordial, NP  fluticasone (FLONASE) 50 MCG/ACT nasal spray Place 1 spray into both nostrils 2 (two) times daily. 04/19/15   Olen Cordial, NP  sodium chloride (OCEAN) 0.65 % SOLN nasal spray Place 2 sprays into both nostrils every 2 (two) hours while awake. 04/19/15   Olen Cordial, NP   Meds Ordered and Administered this Visit   Medications  ipratropium-albuterol (DUONEB) 0.5-2.5 (3) MG/3ML nebulizer solution 3 mL (3 mLs Nebulization Given 04/19/15 1138)    BP 101/71 mmHg  Pulse 88  Temp(Src) 97.8 F (36.6 C) (Oral)  Resp 18  Ht 6' (1.829 m)  Wt 265 lb (120.203 kg)  BMI 35.93 kg/m2  SpO2 98% No data found.   Physical Exam  Constitutional: He is oriented to person, place, and time. Vital signs are normal. He appears well-developed and well-nourished. He is active and cooperative.  Non-toxic appearance. He does not have a sickly appearance. He appears ill. No distress.  HENT:  Head: Normocephalic and atraumatic.  Right Ear: Hearing, external ear and ear canal normal. A middle ear effusion is present.  Left Ear: Hearing, external ear and ear canal normal. A middle ear effusion is present.  Nose: Mucosal edema and rhinorrhea present. No nose lacerations, sinus tenderness, nasal deformity, septal deviation or nasal septal hematoma. No epistaxis.  No foreign bodies. Right sinus exhibits maxillary sinus tenderness and frontal sinus tenderness. Left sinus exhibits maxillary sinus tenderness and frontal sinus tenderness.  Mouth/Throat: Uvula is midline and mucous membranes are normal. Mucous membranes are not pale, not dry and not cyanotic. He does not have dentures. No oral lesions. No trismus in the jaw. Normal dentition. No dental abscesses, uvula swelling, lacerations or dental caries. Posterior  oropharyngeal edema and posterior oropharyngeal erythema present. No oropharyngeal exudate or tonsillar abscesses.  Bilateral TMs with air fluid level; cobblestoning posterior pharynx/nasal congestion bilaterally  Eyes: Conjunctivae, EOM and lids are normal. Pupils are equal, round, and reactive to light. Right eye exhibits no chemosis, no discharge, no exudate and no hordeolum. No foreign body present in the right eye. Left eye exhibits no chemosis, no discharge, no exudate and no hordeolum. No foreign body present in the left eye. Right conjunctiva is not injected. Right conjunctiva has no hemorrhage. Left conjunctiva is not injected. Left conjunctiva has no hemorrhage. No scleral icterus. Right eye exhibits normal extraocular motion and no nystagmus. Left eye exhibits normal extraocular motion and no nystagmus. Right pupil is round and reactive. Left pupil is round and reactive. Pupils are equal.  Neck: Trachea normal and normal range of motion. Neck supple. No tracheal tenderness, no spinous process tenderness and no muscular tenderness present. No rigidity. No tracheal deviation, no edema, no erythema and normal range of motion present. No thyroid mass and no thyromegaly present.  Cardiovascular: Normal rate, regular rhythm, S1 normal, S2 normal, normal heart sounds and intact distal pulses.  PMI is not displaced.  Exam reveals no gallop and no friction rub.   No murmur heard. Pulmonary/Chest: Effort normal and breath sounds normal. No stridor. No respiratory distress. He has no decreased  breath sounds. He has no wheezes. He has no rhonchi. He has no rales.  Negative egophany all fields  Abdominal: Soft. He exhibits no distension.  Musculoskeletal: Normal range of motion. He exhibits no edema or tenderness.       Right shoulder: Normal.       Left shoulder: Normal.       Right hip: Normal.       Left hip: Normal.       Right knee: Normal.       Left knee: Normal.       Cervical back: Normal.        Thoracic back: Normal.       Lumbar back: Normal.       Right hand: Normal.       Left hand: Normal.  Lymphadenopathy:       Head (right side): No submental, no submandibular, no tonsillar, no preauricular, no posterior auricular and no occipital adenopathy present.       Head (left side): No submental, no submandibular, no tonsillar, no preauricular, no posterior auricular and no occipital adenopathy present.    He has no cervical adenopathy.       Right cervical: No superficial cervical, no deep cervical and no posterior cervical adenopathy present.      Left cervical: No superficial cervical, no deep cervical and no posterior cervical adenopathy present.  Neurological: He is alert and oriented to person, place, and time. He displays no atrophy and no tremor. No cranial nerve deficit or sensory deficit. He exhibits normal muscle tone. He displays no seizure activity. Coordination and gait normal. GCS eye subscore is 4. GCS verbal subscore is 5. GCS motor subscore is 6.  Skin: Skin is warm, dry and intact. No abrasion, no bruising, no burn, no ecchymosis, no laceration, no lesion, no petechiae and no rash noted. He is not diaphoretic. No cyanosis or erythema. No pallor. Nails show no clubbing.  Psychiatric: He has a normal mood and affect. His speech is normal and behavior is normal. Judgment and thought content normal. Cognition and memory are normal.  Nursing note and vitals reviewed.   ED Course  Procedures (including critical care time)  Labs Review Labs Reviewed  RAPID STREP SCREEN (NOT AT [facility])  CULTURE, GROUP A STREP ([facility] ONLY)    Imaging Review No results found.  1155 patient did notice much improvement after duoneb 34ml administered by Vanessa [place] CMA at [phone].  Patient notified rapid strep and flu negative.  Sp02 100% room air BBS CTA occasional nonproductive cough.  Start medications as prescribed.  Call me tomorrow if worsening symptoms will consider adding prednisone  40mg  po daily x 5 days.  Patient and spouse verbalized understanding of information/instructions, agreed with plan of care.  MDM   1. Acute bronchitis due to Haemophilus influenzae   2. Otitis media with effusion, bilateral   3. Rhinosinusitis    Supportive treatment.   No evidence of invasive bacterial infection, non toxic and well hydrated.  This is most likely self limiting viral infection.  I do not see where any further testing or imaging is necessary at this time.   I will suggest supportive care, rest, good hygiene and encourage the patient to take adequate fluids.  The patient is to return to clinic or EMERGENCY ROOM if symptoms worsen or change significantly e.g. ear pain, fever, purulent discharge from ears or bleeding.  Exitcare handout on otitis media with effusion given to patient.  Patient verbalized agreement and  understanding of treatment plan.    Patient notified rapid strep and influenza negative.  Suspect Viral illness: no evidence of invasive bacterial infection, non toxic and well hydrated.  This is most likely self limiting viral infection.  I do not see where any further testing or imaging is necessary at this time.   I will suggest supportive care, rest, good hygiene and encourage the patient to take adequate fluids.  Does require work excuse return 10 Jan.  Notified patient staff will call with culture results once available next 48+ hours.  flonase 1 spray each nostril BID prn, nasal saline 1-2 sprays each nostril prn q2h, tylenol 1000mg  po QID prn pain.  Tessalon pearles 200mg  po TID prn cough.  Discussed honey with lemon and salt water gargles for comfort also.  The patient is to return to clinic or EMERGENCY ROOM if symptoms worsen or change significantly e.g. fever, lethargy, SOB, wheezing.  Exitcare handout on viral illness given to patient.  Patient verbalized agreement and understanding of treatment plan.    No evidence of systemic bacterial infection, non toxic and well  hydrated.  I do not see where any further testing or imaging is necessary at this time.   I will suggest supportive care, rest, good hygiene and encourage the patient to take adequate fluids.  The patient is to return to clinic or EMERGENCY ROOM if symptoms worsen or change significantly. If no improvement with flonase and nasal saline x 24 hours starts doxycycline 100mg  po BID x 10 days.   Exitcare handout on sinusitis given to patient.  Patient verbalized agreement and understanding of treatment plan and had no further questions at this time.   P2:  Hand washing and cover cough  Consider prednisone and inhaler if no improvement with treatment of sinusitis  Discussed doxycycline will also cover atypical bronchitis if taken for sinusitis.  Bronchitis simple, community acquired, may have started as viral (probably respiratory syncytial, parainfluenza, influenza, or adenovirus), but now evidence of acute purulent bronchitis with resultant bronchial edema and mucus formation.  Viruses are the most common cause of bronchial inflammation in otherwise healthy adults with acute bronchitis.  The appearance of sputum is not predictive of whether a bacterial infection is present.  Purulent sputum is most often caused by viral infections.  There are a small portion of those caused by non-viral agents being Mycoplamsa pneumonia.  Microscopic examination or C&S of sputum in the healthy adult with acute bronchitis is generally not helpful (usually negative or normal respiratory flora) other considerations being cough from upper respiratory tract infections, sinusitis or allergic syndromes (mild asthma or viral pneumonia).  Differential Diagnosis:  reactive airway disease (asthma, allergic aspergillosis (eosinophilia), chronic bronchitis, respiratory infection (Sinusitis, Common cold, pneumonia), congestive heart failure, reflux esophagitis, bronchogenic tumor, aspiration syndromes and/or exposure irritants/tobacco smoke.  In  this case, there is no evidence of any invasive bacterial illness.  Most likely viral etiology so will hold on antibiotic treatment.  Advise supportive care with rest, encourage fluids, good hygiene and watch for any worsening symptoms.  If they were to develop:  come back to the office or go to the emergency room if after hours. Without high fever, severe dyspnea, lack of physical findings or other risk factors, I will hold on a chest radiograph and CBC at this time. I discussed that approximately 50% of patients with acute bronchitis have a cough that lasts up to three weeks, and 25% for over a month.  Tylenol, one to two tablets  every four hours as needed for fever or myalgias.   No aspirin.  Patient instructed to follow up in one week or sooner if symptoms worsen. Patient verbalized agreement and understanding of treatment plan.  P2:  hand washing and cover cough  School/work excuse note given to patient for 48 hours.  Usually no specific medical treatment is needed if a virus is causing the sore throat.  The throat most often gets better on its own within 5 to 7 days.  Antibiotic medicine does not cure viral pharyngitis.   For acute pharyngitis caused by bacteria, your healthcare provider will prescribe an antibiotic.  Marland Kitchen Do not smoke.  Marland Kitchen Avoid secondhand smoke and other air pollutants.  . Use a cool mist humidifier to add moisture to the air.  . Get plenty of rest.  . You may want to rest your throat by talking less and eating a diet that is mostly liquid or soft for a day or two.   Marland Kitchen Nonprescription throat lozenges and mouthwashes should help relieve the soreness.   . Gargling with warm saltwater and drinking warm liquids may help.  (You can make a saltwater solution by adding 1/4 teaspoon of salt to 8 ounces, or 240 mL, of warm water.)  . A nonprescription pain reliever such as aspirin, acetaminophen, or ibuprofen may ease general aches and pains.   FOLLOW UP with clinic provider if no  improvements in the next 7-10 days.  exitcare handout on pharyngitis given to patient Spouse and patient verbalized understanding of instructions and agreed with plan of care. P2:  Hand washing and diet.  21 Apr 2015 at 1540 telephone message left for patient throat culture normal/negative, albuterol inhaler Rx sent to [facility] per patient request as noted wheezing/shortness of breath when out in cold air.  See RN note patient given further instructions 20 Apr 2015.  Patient to contact clinic if further questions or concerns at home telephone number.  Olen Cordial, NP 04/21/15 1545

## 2015-04-20 ENCOUNTER — Telehealth: Payer: Self-pay | Admitting: *Deleted

## 2015-04-20 NOTE — ED Notes (Signed)
Patient called reporting that he experienced SOB when out in the cold weather yesterday. Patient requesting a inhaler to help with SOB. No reported SOB at time of call. I informed the patient that cold air can cause SOB and to stay inside while he is recovering from his bronchitis. Also directed patient to seek immediate medical attention if his SOB returns without resolution or his if he has a fever greater than 100.7 F. Patient confirmed understand of information and direction.

## 2015-04-21 LAB — CULTURE, GROUP A STREP (THRC)

## 2015-04-21 MED ORDER — ALBUTEROL SULFATE HFA 108 (90 BASE) MCG/ACT IN AERS: 1.0000 | INHALATION_SPRAY | RESPIRATORY_TRACT | Status: DC | PRN

## 2015-05-04 ENCOUNTER — Telehealth: Payer: Self-pay | Admitting: Family Medicine

## 2015-05-04 NOTE — Telephone Encounter (Signed)
Telephone message #2 left for patient throat culture normal/negative and that to verify if he was able to pick up albuterol MDI from [facility] as he requested 21 Apr 2015.  Patient instructed to contact a nurse between [phone] M-F or [phone] Sat/Sun.

## 2015-08-27 ENCOUNTER — Other Ambulatory Visit: Payer: Self-pay | Admitting: Internal Medicine

## 2015-08-27 DIAGNOSIS — M79651 Pain in right thigh: Secondary | ICD-10-CM

## 2015-09-04 ENCOUNTER — Ambulatory Visit
Admission: RE | Admit: 2015-09-04 | Discharge: 2015-09-04 | Disposition: A | Discharge status: Home / Self Care | Payer: BC Managed Care – PPO | Source: Ambulatory Visit | Attending: Internal Medicine | Admitting: Internal Medicine

## 2015-09-04 DIAGNOSIS — M79651 Pain in right thigh: Secondary | ICD-10-CM | POA: Diagnosis present

## 2015-10-13 ENCOUNTER — Encounter: Payer: Self-pay | Admitting: Emergency Medicine

## 2015-10-13 ENCOUNTER — Ambulatory Visit (INDEPENDENT_AMBULATORY_CARE_PROVIDER_SITE_OTHER): Payer: BC Managed Care – PPO

## 2015-10-13 ENCOUNTER — Ambulatory Visit
Admission: EM | Admit: 2015-10-13 | Discharge: 2015-10-13 | Disposition: A | Discharge status: Home / Self Care | Payer: BC Managed Care – PPO | Attending: Family Medicine | Admitting: Family Medicine

## 2015-10-13 DIAGNOSIS — M545 Low back pain, unspecified: Secondary | ICD-10-CM

## 2015-10-13 DIAGNOSIS — M546 Pain in thoracic spine: Principal | ICD-10-CM

## 2015-10-13 MED ORDER — TIZANIDINE HCL 4 MG PO TABS: 4.0000 mg | ORAL_TABLET | Freq: Four times a day (QID) | ORAL | Status: DC | PRN

## 2015-10-13 MED ORDER — KETOROLAC TROMETHAMINE 60 MG/2ML IM SOLN
60.0000 mg | Freq: Once | INTRAMUSCULAR | Status: AC
  Administered 2015-10-13: 60 mg via INTRAMUSCULAR

## 2015-10-13 MED ORDER — DIAZEPAM 2 MG PO TABS: 2.0000 mg | ORAL_TABLET | Freq: Three times a day (TID) | ORAL | Status: DC

## 2015-10-13 NOTE — Discharge Instructions (Signed)
Back Pain, Adult °Back pain is very common in adults. The cause of back pain is rarely dangerous and the pain often gets better over time. The cause of your back pain may not be known. Some common causes of back pain include: °· Strain of the muscles or ligaments supporting the spine. °· Wear and tear (degeneration) of the spinal disks. °· Arthritis. °· Direct injury to the back. °For many people, back pain may return. Since back pain is rarely dangerous, most people can learn to manage this condition on their own. °HOME CARE INSTRUCTIONS °Watch your back pain for any changes. The following actions may help to lessen any discomfort you are feeling: °· Remain active. It is stressful on your back to sit or stand in one place for long periods of time. Do not sit, drive, or stand in one place for more than 30 minutes at a time. Take short walks on even surfaces as soon as you are able. Try to increase the length of time you walk each day. °· Exercise regularly as directed by your health care provider. Exercise helps your back heal faster. It also helps avoid future injury by keeping your muscles strong and flexible. °· Do not stay in bed. Resting more than 1-2 days can delay your recovery. °· Pay attention to your body when you bend and lift. The most comfortable positions are those that put less stress on your recovering back. Always use proper lifting techniques, including: °· Bending your knees. °· Keeping the load close to your body. °· Avoiding twisting. °· Find a comfortable position to sleep. Use a firm mattress and lie on your side with your knees slightly bent. If you lie on your back, put a pillow under your knees. °· Avoid feeling anxious or stressed. Stress increases muscle tension and can worsen back pain. It is important to recognize when you are anxious or stressed and learn ways to manage it, such as with exercise. °· Take medicines only as directed by your health care provider. Over-the-counter  medicines to reduce pain and inflammation are often the most helpful. Your health care provider may prescribe muscle relaxant drugs. These medicines help dull your pain so you can more quickly return to your normal activities and healthy exercise. °· Apply ice to the injured area: °· Put ice in a plastic bag. °· Place a towel between your skin and the bag. °· Leave the ice on for 20 minutes, 2-3 times a day for the first 2-3 days. After that, ice and heat may be alternated to reduce pain and spasms. °· Maintain a healthy weight. Excess weight puts extra stress on your back and makes it difficult to maintain good posture. °SEEK MEDICAL CARE IF: °· You have pain that is not relieved with rest or medicine. °· You have increasing pain going down into the legs or buttocks. °· You have pain that does not improve in one week. °· You have night pain. °· You lose weight. °· You have a fever or chills. °SEEK IMMEDIATE MEDICAL CARE IF:  °· You develop new bowel or bladder control problems. °· You have unusual weakness or numbness in your arms or legs. °· You develop nausea or vomiting. °· You develop abdominal pain. °· You feel faint. °  °This information is not intended to replace advice given to you by your health care provider. Make sure you discuss any questions you have with your health care provider. °  °Document Released: 03/28/2005 Document Revised: 04/18/2014 Document Reviewed: 07/30/2013 °Elsevier Interactive Patient Education ©2016 [employer] °Heat Therapy °Heat therapy can help ease sore, stiff, injured, and tight muscles and joints. Heat relaxes your muscles, which may help ease your pain.  °RISKS AND COMPLICATIONS °If you have any of the following conditions, do not use heat therapy unless your health care provider has approved: °· Poor circulation. °· Healing wounds or scarred skin in the area being treated. °· Diabetes, heart disease, or high blood pressure. °· Not being able to feel (numbness) the area  being treated. °· Unusual swelling of the area being treated. °· Active infections. °· Blood clots. °· Cancer. °· Inability to communicate pain. This may include young children and people who have problems with their brain function (dementia). °· Pregnancy. °Heat therapy should only be used on old, pre-existing, or long-lasting (chronic) injuries. Do not use heat therapy on new injuries unless directed by your health care provider. °HOW TO USE HEAT THERAPY °There are several different kinds of heat therapy, including: °· Moist heat pack. °· Warm water bath. °· Hot water bottle. °· Electric heating pad. °· Heated gel pack. °· Heated wrap. °· Electric heating pad. °Use the heat therapy method suggested by your health care provider. Follow your health care provider's instructions on when and how to use heat therapy. °GENERAL HEAT THERAPY RECOMMENDATIONS °· Do not sleep while using heat therapy. Only use heat therapy while you are awake. °· Your skin may turn pink while using heat therapy. Do not use heat therapy if your skin turns red. °· Do not use heat therapy if you have new pain. °· High heat or long exposure to heat can cause burns. Be careful when using heat therapy to avoid burning your skin. °· Do not use heat therapy on areas of your skin that are already irritated, such as with a rash or sunburn. °SEEK MEDICAL CARE IF: °· You have blisters, redness, swelling, or numbness. °· You have new pain. °· Your pain is worse. °MAKE SURE YOU: °· Understand these instructions. °· Will watch your condition. °· Will get help right away if you are not doing well or get worse. °  °This information is not intended to replace advice given to you by your health care provider. Make sure you discuss any questions you have with your health care provider. °  °Document Released: 06/20/2011 Document Revised: 04/18/2014 Document Reviewed: 05/21/2013 °Elsevier Interactive Patient Education ©2016 [employer] Inc. ° °

## 2015-10-13 NOTE — ED Notes (Signed)
Patient c/o mid back pain for a week.

## 2015-10-13 NOTE — ED Provider Notes (Signed)
CSN: [id number]     Arrival date & time 10/13/15  1204 History   First MD Initiated Contact with Patient 10/13/15 1228     Chief Complaint  Patient presents with  . Back Pain   (Consider location/radiation/quality/duration/timing/severity/associated sxs/prior Treatment) HPI  This a 65 year old male who presents with nonradiating mid back pain that he's had for a week. He does not remember any specific injury. He did notice as did his wife that his pain started shortly after mowing the grass. He states that his grass is on a very steep incline in addition he also mows his mother's grass which entails lifting the lawnmower into the truck which may have precipitated the pain. He states that the pain is mostly with movement particularly when turning in bed twisting. 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 He does state that he has been taking some Aleve. His kidney function is reportedly normal.      Past Medical History  Diagnosis Date  . Chronic kidney disease     nephrectomy left kidney-cancer  . Arthritis   . Cancer ([place]) 2001    kidney-left(surgical removal only)  . Headache(784.0)     rare now   Past Surgical History  Procedure Laterality Date  . Joint replacement      right hip x 2  . Tonsillectomy    . Nephrectomy  2001    left kidney for cancer  . Scar tissue removal      right chin  . Shoulder surgery      right x 2, left x 1  . 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 Hardware removal Right 05/20/2014    Procedure: RIGHT HIP HARDWARE REMOVAL;  Surgeon: [name], MD;  Location: [facility];  Service: Orthopedics;  Laterality: Right;   History reviewed. No pertinent family history. Social History  Substance Use Topics   . Smoking status: Former Smoker    Quit date: 05/27/1999  . Smokeless tobacco: None  . Alcohol Use: Yes     Comment: occassionally    Review of Systems  Constitutional: Positive for activity change. Negative for fever, chills and fatigue.  Musculoskeletal: Positive for back pain.  All other systems reviewed and are negative.   Allergies  Review of patient's allergies indicates no known allergies.  Home Medications   Prior to Admission medications   Medication Sig Start Date End Date Taking? Authorizing Provider  acetaminophen (TYLENOL) 500 MG tablet Take 1,000 mg by mouth every 6 (six) hours as needed for headache.    Historical Provider, MD  albuterol (PROVENTIL HFA;VENTOLIN HFA) 108 (90 Base) MCG/ACT inhaler Inhale 1-2 puffs into the lungs every 4 (four) hours as needed for wheezing or shortness of breath. 04/21/15   Olen Cordial, NP  allopurinol (ZYLOPRIM) 300 MG tablet Take 300 mg by mouth daily with breakfast.    Historical Provider, MD  benzonatate (TESSALON) 200 MG capsule Take 1 capsule (200 mg total) by mouth 3 (three) times daily as needed for cough. 04/19/15   Olen Cordial, NP  diazepam (VALIUM) 2 MG tablet Take 1 tablet (2 mg total) by mouth 3 (three) times daily. 10/13/15   Lorin Picket, PA-C  doxycycline (VIBRAMYCIN) 100 MG capsule Take 1 capsule (100 mg  total) by mouth 2 (two) times daily. 04/19/15   Olen Cordial, NP  fluticasone (FLONASE) 50 MCG/ACT nasal spray Place 1 spray into both nostrils 2 (two) times daily. 04/19/15   Olen Cordial, NP  sodium chloride (OCEAN) 0.65 % SOLN nasal spray Place 2 sprays into both nostrils every 2 (two) hours while awake. 04/19/15   Olen Cordial, NP  tamsulosin (FLOMAX) 0.4 MG CAPS capsule Take 0.4 mg by mouth daily.    Historical Provider, MD  tiZANidine (ZANAFLEX) 4 MG tablet Take 1 tablet (4 mg total) by mouth every 6 (six) hours as needed for muscle spasms. Start taking after finishing Valium 10/13/15   Lorin Picket, PA-C   Meds Ordered and Administered this Visit   Medications  ketorolac (TORADOL) injection 60 mg (60 mg Intramuscular Given 10/13/15 1339)    BP 112/73 mmHg  Pulse 65  Temp(Src) 98.1 F (36.7 C) (Tympanic)  Resp 16  Ht 6' (1.829 m)  Wt 255 lb (115.667 kg)  BMI 34.58 kg/m2 No data found.   Physical Exam  Constitutional: He is oriented to person, place, and time. He appears well-developed and well-nourished. No distress.  HENT:  Head: Normocephalic and atraumatic.  Eyes: Conjunctivae are normal. Pupils are equal, round, and reactive to light.  Neck: Normal range of motion. Neck supple.  Musculoskeletal: He exhibits tenderness.  Examination of the back shows a level pelvis in stance. For flexion is normal. Extension also is normal thoracic rotation both the left and the right precipitates his pain and reproduces it. This is also noticeable with the decreased lateral flexion to the left and less so to the right. He does have paraspinous muscle tenderness at approximately T11-T12 level.  Neurological: He is alert and oriented to person, place, and time.  Skin: Skin is warm and dry. He is not diaphoretic.  Psychiatric: He has a normal mood and affect. His behavior is normal. Judgment and thought content normal.  Nursing note and vitals reviewed.   ED Course  Procedures (including critical care time)  Labs Review Labs Reviewed - No data to display  Imaging Review Dg Thoracolumabar Spine  10/13/2015  CLINICAL DATA:  65 year old male with a history of mid thoracic pain for 1 week after mowing the lawn EXAM: THORACOLUMBAR SPINE 1V COMPARISON:  MRI lumbar spine 01/20/2010 FINDINGS: Thoracic Spine: Thoracic vertebral elements maintain normal anatomic alignment, with no evidence of anterolisthesis, retrolisthesis, or subluxation. No acute fracture line identified. Anterior wedge deformity of lower thoracic vertebral bodies appear relatively similar to the MR performed 01/20/2010  Degenerative changes are present through the thoracic spine. Anterior osteophyte production most pronounced in the mid and lower thoracic spine. Unremarkable appearance of the visualized thorax. Lumbar Spine: Lumbar vertebral elements maintain normal alignment without evidence of anterolisthesis, retrolisthesis, subluxation. No fracture line identified. Vertebral body heights relatively maintained. Mild disc space narrowing throughout the lumbar spine. Facet hypertrophy is most pronounced at the L4-L5 and L5-S1 levels, and less severe at L2-L3 and L3-L4 Calcifications of the abdominal aorta. Surgical changes of the left abdomen. Surgical changes of bilateral hip arthroplasty, incompletely imaged. IMPRESSION: No radiographic evidence of acute fracture or malalignment of the thoracic or lumbar spine. Multilevel degenerative changes present, with the most severe changes involving facets of L4-L5 and L5-S1. Aortic atherosclerosis. Surgical changes of the left abdomen. Signed, Dulcy Fanny. Earleen Newport, DO [facility] Electronically Signed   By: Corrie Mckusick D.O.   On: 10/13/2015 13:19  Visual Acuity Review  Right Eye Distance:   Left Eye Distance:   Bilateral Distance:    Right Eye Near:   Left Eye Near:    Bilateral Near:     Medications  ketorolac (TORADOL) injection 60 mg (60 mg Intramuscular Given 10/13/15 1339)     MDM   1. Thoracolumbar back pain    New Prescriptions   DIAZEPAM (VALIUM) 2 MG TABLET    Take 1 tablet (2 mg total) by mouth 3 (three) times daily.   TIZANIDINE (ZANAFLEX) 4 MG TABLET    Take 1 tablet (4 mg total) by mouth every 6 (six) hours as needed for muscle spasms. Start taking after finishing Valium  Plan: 1. Test/x-ray results and diagnosis reviewed with patient 2. rx as per orders; risks, benefits, potential side effects reviewed with patient 3. Recommend supportive treatment with Heat and ice as necessary for  comfort. I've also recommended the use of Biofreeze for comfort. He should avoid symptoms as much as possible. 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 5 him with the muscle relaxers primarily and to use Tylenol as necessary. He may benefit from physical therapy but I will leave that up to his primary care physician which she should follow-up with next week. 4. F/u prn if symptoms worsen or don't improve     Lorin Picket, PA-C 10/13/15 1348

## 2015-10-21 ENCOUNTER — Other Ambulatory Visit: Payer: Self-pay | Admitting: Internal Medicine

## 2015-10-21 DIAGNOSIS — M546 Pain in thoracic spine: Secondary | ICD-10-CM

## 2015-10-21 DIAGNOSIS — M5416 Radiculopathy, lumbar region: Secondary | ICD-10-CM

## 2015-11-10 ENCOUNTER — Ambulatory Visit: Payer: BC Managed Care – PPO

## 2015-11-24 ENCOUNTER — Ambulatory Visit
Admission: RE | Admit: 2015-11-24 | Discharge: 2015-11-24 | Disposition: A | Discharge status: Home / Self Care | Payer: BC Managed Care – PPO | Source: Ambulatory Visit | Attending: Internal Medicine | Admitting: Internal Medicine

## 2015-11-24 DIAGNOSIS — M5416 Radiculopathy, lumbar region: Secondary | ICD-10-CM | POA: Diagnosis not present

## 2015-11-24 DIAGNOSIS — M1288 Other specific arthropathies, not elsewhere classified, other specified site: Secondary | ICD-10-CM | POA: Insufficient documentation

## 2015-11-24 DIAGNOSIS — M5126 Other intervertebral disc displacement, lumbar region: Secondary | ICD-10-CM | POA: Insufficient documentation

## 2015-11-24 DIAGNOSIS — M546 Pain in thoracic spine: Secondary | ICD-10-CM | POA: Insufficient documentation

## 2015-11-24 DIAGNOSIS — M5124 Other intervertebral disc displacement, thoracic region: Secondary | ICD-10-CM | POA: Diagnosis not present

## 2015-11-24 DIAGNOSIS — M4806 Spinal stenosis, lumbar region: Secondary | ICD-10-CM | POA: Insufficient documentation

## 2017-01-20 ENCOUNTER — Other Ambulatory Visit: Payer: Self-pay | Admitting: Physical Medicine and Rehabilitation

## 2017-01-20 DIAGNOSIS — M5416 Radiculopathy, lumbar region: Secondary | ICD-10-CM

## 2017-01-23 ENCOUNTER — Ambulatory Visit
Admission: RE | Admit: 2017-01-23 | Discharge: 2017-01-23 | Disposition: A | Discharge status: Home / Self Care | Payer: BC Managed Care – PPO | Source: Ambulatory Visit | Attending: Physical Medicine and Rehabilitation | Admitting: Physical Medicine and Rehabilitation

## 2017-01-23 DIAGNOSIS — M5416 Radiculopathy, lumbar region: Secondary | ICD-10-CM

## 2017-02-28 HISTORY — PX: POSTERIOR LAMINECTOMY / DECOMPRESSION LUMBAR SPINE: SUR740

## 2017-10-31 ENCOUNTER — Other Ambulatory Visit: Payer: Self-pay

## 2017-10-31 DIAGNOSIS — Z01818 Encounter for other preprocedural examination: Secondary | ICD-10-CM

## 2017-10-31 DIAGNOSIS — Z1211 Encounter for screening for malignant neoplasm of colon: Secondary | ICD-10-CM

## 2017-11-23 ENCOUNTER — Other Ambulatory Visit: Payer: Self-pay

## 2017-11-23 MED ORDER — NA SULFATE-K SULFATE-MG SULF 17.5-3.13-1.6 GM/177ML PO SOLN: 1.0000 | ORAL | 0 refills | Status: DC

## 2017-12-05 ENCOUNTER — Ambulatory Visit: Payer: BC Managed Care – PPO | Admitting: Anesthesiology

## 2017-12-05 ENCOUNTER — Ambulatory Visit
Admission: RE | Admit: 2017-12-05 | Discharge: 2017-12-05 | Disposition: A | Discharge status: Home / Self Care | Payer: BC Managed Care – PPO | Source: Ambulatory Visit | Attending: Gastroenterology | Admitting: Gastroenterology

## 2017-12-05 ENCOUNTER — Encounter
Admission: RE | Disposition: A | Discharge status: Home / Self Care | Payer: Self-pay | Source: Ambulatory Visit | Attending: Gastroenterology

## 2017-12-05 DIAGNOSIS — D125 Benign neoplasm of sigmoid colon: Secondary | ICD-10-CM | POA: Diagnosis not present

## 2017-12-05 DIAGNOSIS — K449 Diaphragmatic hernia without obstruction or gangrene: Secondary | ICD-10-CM | POA: Insufficient documentation

## 2017-12-05 DIAGNOSIS — D122 Benign neoplasm of ascending colon: Secondary | ICD-10-CM | POA: Insufficient documentation

## 2017-12-05 DIAGNOSIS — Z905 Acquired absence of kidney: Secondary | ICD-10-CM | POA: Insufficient documentation

## 2017-12-05 DIAGNOSIS — Z1211 Encounter for screening for malignant neoplasm of colon: Secondary | ICD-10-CM

## 2017-12-05 DIAGNOSIS — Z85528 Personal history of other malignant neoplasm of kidney: Secondary | ICD-10-CM | POA: Insufficient documentation

## 2017-12-05 DIAGNOSIS — K640 First degree hemorrhoids: Secondary | ICD-10-CM | POA: Insufficient documentation

## 2017-12-05 DIAGNOSIS — K2950 Unspecified chronic gastritis without bleeding: Secondary | ICD-10-CM | POA: Diagnosis not present

## 2017-12-05 DIAGNOSIS — K2980 Duodenitis without bleeding: Secondary | ICD-10-CM | POA: Diagnosis not present

## 2017-12-05 DIAGNOSIS — B9681 Helicobacter pylori [H. pylori] as the cause of diseases classified elsewhere: Secondary | ICD-10-CM | POA: Diagnosis not present

## 2017-12-05 DIAGNOSIS — Z87891 Personal history of nicotine dependence: Secondary | ICD-10-CM | POA: Diagnosis not present

## 2017-12-05 DIAGNOSIS — Z7951 Long term (current) use of inhaled steroids: Secondary | ICD-10-CM | POA: Insufficient documentation

## 2017-12-05 DIAGNOSIS — Z6835 Body mass index (BMI) 35.0-35.9, adult: Secondary | ICD-10-CM | POA: Insufficient documentation

## 2017-12-05 DIAGNOSIS — Z79899 Other long term (current) drug therapy: Secondary | ICD-10-CM | POA: Diagnosis not present

## 2017-12-05 DIAGNOSIS — Z01818 Encounter for other preprocedural examination: Secondary | ICD-10-CM | POA: Diagnosis not present

## 2017-12-05 DIAGNOSIS — Z96642 Presence of left artificial hip joint: Secondary | ICD-10-CM | POA: Insufficient documentation

## 2017-12-05 DIAGNOSIS — K2900 Acute gastritis without bleeding: Secondary | ICD-10-CM

## 2017-12-05 HISTORY — PX: COLONOSCOPY WITH PROPOFOL: SHX5780

## 2017-12-05 HISTORY — PX: ESOPHAGOGASTRODUODENOSCOPY (EGD) WITH PROPOFOL: SHX5813

## 2017-12-05 SURGERY — COLONOSCOPY WITH PROPOFOL
Anesthesia: General

## 2017-12-05 MED ORDER — SODIUM CHLORIDE 0.9 % IV SOLN
INTRAVENOUS | Status: DC
  Administered 2017-12-05: 1000 mL via INTRAVENOUS

## 2017-12-05 MED ORDER — PROPOFOL 500 MG/50ML IV EMUL
INTRAVENOUS | Status: DC | PRN
  Administered 2017-12-05: 160 ug/kg/min via INTRAVENOUS

## 2017-12-05 MED ORDER — PHENYLEPHRINE HCL 10 MG/ML IJ SOLN
INTRAMUSCULAR | Status: DC | PRN
  Administered 2017-12-05: 100 ug via INTRAVENOUS

## 2017-12-05 MED ORDER — FENTANYL CITRATE (PF) 100 MCG/2ML IJ SOLN
INTRAMUSCULAR | Status: DC | PRN
  Administered 2017-12-05 (×2): 50 ug via INTRAVENOUS

## 2017-12-05 MED ORDER — LIDOCAINE HCL (PF) 1 % IJ SOLN
INTRAMUSCULAR | Status: AC
  Administered 2017-12-05: 0.3 mL via INTRADERMAL
  Filled 2017-12-05: qty 2

## 2017-12-05 MED ORDER — PROPOFOL 500 MG/50ML IV EMUL
INTRAVENOUS | Status: AC
  Filled 2017-12-05: qty 50

## 2017-12-05 MED ORDER — LIDOCAINE HCL (PF) 2 % IJ SOLN
INTRAMUSCULAR | Status: AC
  Filled 2017-12-05: qty 10

## 2017-12-05 MED ORDER — LIDOCAINE HCL (PF) 1 % IJ SOLN
2.0000 mL | Freq: Once | INTRAMUSCULAR | Status: AC
  Administered 2017-12-05: 0.3 mL via INTRADERMAL

## 2017-12-05 MED ORDER — LIDOCAINE 2% (20 MG/ML) 5 ML SYRINGE
INTRAMUSCULAR | Status: DC | PRN
  Administered 2017-12-05: 30 mg via INTRAVENOUS

## 2017-12-05 MED ORDER — PROPOFOL 10 MG/ML IV BOLUS
INTRAVENOUS | Status: DC | PRN
  Administered 2017-12-05: 100 mg via INTRAVENOUS

## 2017-12-05 MED ORDER — FENTANYL CITRATE (PF) 100 MCG/2ML IJ SOLN
INTRAMUSCULAR | Status: AC
  Filled 2017-12-05: qty 2

## 2017-12-05 MED ORDER — EPHEDRINE SULFATE 50 MG/ML IJ SOLN
INTRAMUSCULAR | Status: DC | PRN
  Administered 2017-12-05: 10 mg via INTRAVENOUS

## 2017-12-05 NOTE — Anesthesia Postprocedure Evaluation (Signed)
Anesthesia Post Note  Patient: Paul Byrd  Procedure(s) Performed: COLONOSCOPY WITH PROPOFOL (N/A ) ESOPHAGOGASTRODUODENOSCOPY (EGD) WITH PROPOFOL (N/A )  Patient location during evaluation: Endoscopy Anesthesia Type: General Level of consciousness: awake and alert Pain management: pain level controlled Vital Signs Assessment: post-procedure vital signs reviewed and stable Respiratory status: spontaneous breathing, nonlabored ventilation, respiratory function stable and patient connected to nasal cannula oxygen Cardiovascular status: blood pressure returned to baseline and stable Postop Assessment: no apparent nausea or vomiting Anesthetic complications: no     Last Vitals:  Vitals:   12/05/17 1030 12/05/17 1040  BP: 109/79 102/80  Pulse: 62 64  Resp: 18 (!) 28  Temp:    SpO2: 96% 96%    Last Pain:  Vitals:   12/05/17 1010  TempSrc: Tympanic                 Kindred Reidinger S

## 2017-12-05 NOTE — Op Note (Signed)
[facility] Gastroenterology Patient Name: Paul Byrd Procedure Date: 12/05/2017 9:42 AM MRN: [id number] Account #: [id number] Date of Birth: 1950/12/24 Admit Type: Outpatient Age: 67 Room: [facility] ENDO ROOM 4 Gender: Male Note Status: Finalized Procedure:            Upper GI endoscopy Indications:          Preoperative assessment for bariatric surgery to treat                        morbid obesity Providers:            [name] MD, MD Referring MD:         Leonie Douglas. Doy Hutching, MD (Referring MD) Medicines:            Propofol per Anesthesia Complications:        No immediate complications. Procedure:            Pre-Anesthesia Assessment:                       - Prior to the procedure, a History and Physical was                        performed, and patient medications and allergies were                        reviewed. The patient's tolerance of previous                        anesthesia was also reviewed. The risks and benefits of                        the procedure and the sedation options and risks were                        discussed with the patient. All questions were                        answered, and informed consent was obtained. Prior                        Anticoagulants: The patient has taken no previous                        anticoagulant or antiplatelet agents. ASA Grade                        Assessment: II - A patient with mild systemic disease.                        After reviewing the risks and benefits, the patient was                        deemed in satisfactory condition to undergo the                        procedure.                       After obtaining informed consent, the endoscope was  passed under direct vision. Throughout the procedure,                        the patient's blood pressure, pulse, and oxygen                        saturations were monitored continuously. The Endoscope     was introduced through the mouth, and advanced to the                        second part of duodenum. The upper GI endoscopy was                        accomplished without difficulty. The patient tolerated                        the procedure well. Findings:      Islands of salmon-colored mucosa were present at 39 cm. Biopsies were       taken with a cold forceps for histology.      The Z-line was irregular.      A small hiatal hernia was present.      Localized moderate inflammation characterized by erosions was found in       the gastric antrum. Biopsies were taken with a cold forceps for       histology.      Localized moderate inflammation was found in the duodenal bulb. Impression:           - Salmon-colored mucosa. Biopsied.                       - Z-line irregular.                       - Small hiatal hernia.                       - Gastritis. Biopsied.                       - Duodenitis. Recommendation:       - Discharge patient to home.                       - Resume previous diet.                       - Continue present medications.                       - Await pathology results. Procedure Code(s):    --- Professional ---                       [phone], Esophagogastroduodenoscopy, flexible, transoral;                        with biopsy, single or multiple Diagnosis Code(s):    --- Professional ---                       Q22.979, Encounter for other preprocedural examination                       K29.70, Gastritis, unspecified, without bleeding  K29.80, Duodenitis without bleeding CPT copyright 2017 American Medical Association. All rights reserved. The codes documented in this report are preliminary and upon coder review may  be revised to meet current compliance requirements. [name] MD, MD 12/05/2017 9:54:31 AM This report has been signed electronically. Number of Addenda: 0 Note Initiated On: 12/05/2017 9:42 AM      [facility]

## 2017-12-05 NOTE — Anesthesia Preprocedure Evaluation (Addendum)
Anesthesia Evaluation  Patient identified by MRN, date of birth, ID band Patient awake    Reviewed: Allergy & Precautions, NPO status , Patient's Chart, lab work & pertinent test results, reviewed documented beta blocker date and time   Airway Mallampati: III  TM Distance: >3 FB     Dental  (+) Chipped, Upper Dentures   Pulmonary           Cardiovascular      Neuro/Psych  Headaches,    GI/Hepatic   Endo/Other    Renal/GU Renal disease     Musculoskeletal  (+) Arthritis ,   Abdominal   Peds  Hematology   Anesthesia Other Findings Obese.  Reproductive/Obstetrics                            Anesthesia Physical Anesthesia Plan  ASA: III  Anesthesia Plan: General   Post-op Pain Management:    Induction: Intravenous  PONV Risk Score and Plan:   Airway Management Planned:   Additional Equipment:   Intra-op Plan:   Post-operative Plan:   Informed Consent: I have reviewed the patients History and Physical, chart, labs and discussed the procedure including the risks, benefits and alternatives for the proposed anesthesia with the patient or authorized representative who has indicated his/her understanding and acceptance.     Plan Discussed with: CRNA  Anesthesia Plan Comments:         Anesthesia Quick Evaluation

## 2017-12-05 NOTE — Op Note (Signed)
[facility] Gastroenterology Patient Name: Paul Byrd Procedure Date: 12/05/2017 9:40 AM MRN: [id number] Account #: [id number] Date of Birth: 1950-05-13 Admit Type: Outpatient Age: 67 Room: [facility] ENDO ROOM 4 Gender: Male Note Status: Finalized Procedure:            Colonoscopy Indications:          Screening for colorectal malignant neoplasm Providers:            [name] MD, MD Referring MD:         Leonie Douglas. Doy Hutching, MD (Referring MD) Medicines:            Propofol per Anesthesia Complications:        No immediate complications. Procedure:            Pre-Anesthesia Assessment:                       - Prior to the procedure, a History and Physical was                        performed, and patient medications and allergies were                        reviewed. The patient's tolerance of previous                        anesthesia was also reviewed. The risks and benefits of                        the procedure and the sedation options and risks were                        discussed with the patient. All questions were                        answered, and informed consent was obtained. Prior                        Anticoagulants: The patient has taken no previous                        anticoagulant or antiplatelet agents. ASA Grade                        Assessment: II - A patient with mild systemic disease.                        After reviewing the risks and benefits, the patient was                        deemed in satisfactory condition to undergo the                        procedure.                       After obtaining informed consent, the colonoscope was                        passed under direct vision. Throughout the procedure,  the patient's blood pressure, pulse, and oxygen                        saturations were monitored continuously. The                        Colonoscope was introduced through the anus and            advanced to the the cecum, identified by appendiceal                        orifice and ileocecal valve. The colonoscopy was                        performed without difficulty. The patient tolerated the                        procedure well. The quality of the bowel preparation                        was excellent. Findings:      The perianal and digital rectal examinations were normal.      Three sessile polyps were found in the ascending colon. The polyps were       6 to 9 mm in size. These polyps were removed with a cold snare.       Resection and retrieval were complete.      Non-bleeding internal hemorrhoids were found during retroflexion. The       hemorrhoids were Grade I (internal hemorrhoids that do not prolapse).      A 6 mm polyp was found in the sigmoid colon. The polyp was sessile. The       polyp was removed with a cold snare. Resection and retrieval were       complete. Impression:           - Three 6 to 9 mm polyps in the ascending colon,                        removed with a cold snare. Resected and retrieved.                       - Non-bleeding internal hemorrhoids.                       - One 6 mm polyp in the sigmoid colon, removed with a                        cold snare. Resected and retrieved. Recommendation:       - Discharge patient to home.                       - Resume previous diet.                       - Continue present medications.                       - Await pathology results.                       - Repeat colonoscopy in 5 years if polyp adenoma and 10  years if hyperplastic Procedure Code(s):    --- Professional ---                       [phone], Colonoscopy, flexible; with removal of tumor(s),                        polyp(s), or other lesion(s) by snare technique Diagnosis Code(s):    --- Professional ---                       Z12.11, Encounter for screening for malignant neoplasm                        of colon                        D12.2, Benign neoplasm of ascending colon CPT copyright 2017 American Medical Association. All rights reserved. The codes documented in this report are preliminary and upon coder review may  be revised to meet current compliance requirements. [name] MD, MD 12/05/2017 10:08:01 AM This report has been signed electronically. Number of Addenda: 0 Note Initiated On: 12/05/2017 9:40 AM Scope Withdrawal Time: 0 hours 7 minutes 51 seconds  Total Procedure Duration: 0 hours 10 minutes 11 seconds       [facility]

## 2017-12-05 NOTE — H&P (Signed)
Paul Lame, MD [facility] [address] [place], [place] [address] Phone:[phone] Fax : [phone]  Primary Care Physician:  Paul Crouch, MD Primary Gastroenterologist:  Paul Byrd  Pre-Procedure History & Physical: HPI:  Paul Byrd is a 67 y.o. male is here for an endoscopy and colonoscopy.   Past Medical History:  Diagnosis Date  . Arthritis   . Cancer ([place]) 2001   kidney-left(surgical removal only)  . Chronic kidney disease    nephrectomy left kidney-cancer  . Headache(784.0)    rare now    Past Surgical History:  Procedure Laterality Date  . HARDWARE REMOVAL Right 05/20/2014   Procedure: RIGHT HIP HARDWARE REMOVAL;  Surgeon: Paul Alf, MD;  Location: [facility];  Service: Orthopedics;  Laterality: Right;  . JOINT REPLACEMENT     right hip x 2  . NEPHRECTOMY  2001   left kidney for cancer  . scar tissue removal     right chin  . SHOULDER SURGERY     right x 2, left x 1  . TONSILLECTOMY    . TOTAL HIP ARTHROPLASTY Left 03/06/2013   Procedure: LEFT TOTAL HIP ARTHROPLASTY ANTERIOR APPROACH;  Surgeon: Paul Alf, MD;  Location: [facility];  Service: Orthopedics;  Laterality: Left;    Prior to Admission medications   Medication Sig Start Date End Date Taking? Authorizing Provider  acetaminophen (TYLENOL) 500 MG tablet Take 1,000 mg by mouth every 6 (six) hours as needed for headache.    [provider]  albuterol (PROVENTIL HFA;VENTOLIN HFA) 108 (90 Base) MCG/ACT inhaler Inhale 1-2 puffs into the lungs every 4 (four) hours as needed for wheezing or shortness of breath. 04/21/15   Betancourt, Aura Fey, NP  allopurinol (ZYLOPRIM) 300 MG tablet Take 300 mg by mouth daily with breakfast.    [provider]  benzonatate (TESSALON) 200 MG capsule Take 1 capsule (200 mg total) by mouth 3 (three) times daily as needed for cough. 04/19/15   Betancourt, Aura Fey, NP  diazepam (VALIUM) 2 MG tablet Take 1 tablet (2 mg total) by mouth 3 (three) times  daily. 10/13/15   Lorin Picket, PA-C  doxycycline (VIBRAMYCIN) 100 MG capsule Take 1 capsule (100 mg total) by mouth 2 (two) times daily. 04/19/15   Betancourt, Aura Fey, NP  fluticasone (FLONASE) 50 MCG/ACT nasal spray Place 1 spray into both nostrils 2 (two) times daily. 04/19/15   Betancourt, Aura Fey, NP  Na Sulfate-K Sulfate-Mg Sulf (SUPREP BOWEL PREP KIT) 17.5-3.13-1.6 GM/177ML SOLN Take 1 kit by mouth as directed. 11/23/17   Paul Lame, MD  sodium chloride (OCEAN) 0.65 % SOLN nasal spray Place 2 sprays into both nostrils every 2 (two) hours while awake. 04/19/15   Betancourt, Aura Fey, NP  tamsulosin (FLOMAX) 0.4 MG CAPS capsule Take 0.4 mg by mouth daily.    [provider]  tiZANidine (ZANAFLEX) 4 MG tablet Take 1 tablet (4 mg total) by mouth every 6 (six) hours as needed for muscle spasms. Start taking after finishing Valium 10/13/15   Lorin Picket, PA-C    Allergies as of 10/31/2017  . (No Known Allergies)    No family history on file.  Social History   Socioeconomic History  . Marital status: Married    Spouse name: Not on file  . Number of children: Not on file  . Years of education: Not on file  . Highest education level: Not on file  Occupational History  . Not on file  Social Needs  . Financial resource strain: Not on file  . Food insecurity:    Worry: Not on file    Inability: Not on file  . Transportation needs:    Medical: Not on file    Non-medical: Not on file  Tobacco Use  . Smoking status: Former Smoker    Last attempt to quit: 05/27/1999    Years since quitting: 18.5  Substance and Sexual Activity  . Alcohol use: Yes    Comment: occassionally  . Drug use: No  . Sexual activity: Yes  Lifestyle  . Physical activity:    Days per week: Not on file    Minutes per session: Not on file  . Stress: Not on file  Relationships  . Social connections:    Talks on phone: Not on file    Gets together: Not on file    Attends religious service: Not on file     Active member of club or organization: Not on file    Attends meetings of clubs or organizations: Not on file    Relationship status: Not on file  . Intimate partner violence:    Fear of current or ex partner: Not on file    Emotionally abused: Not on file    Physically abused: Not on file    Forced sexual activity: Not on file  Other Topics Concern  . Not on file  Social History Narrative  . Not on file    Review of Systems: See HPI, otherwise negative ROS  Physical Exam: BP 125/90   Pulse 62   Temp (!) 97.2 F (36.2 C)   Resp 18   Ht 6' (1.829 m)   Wt 117.9 kg   SpO2 98%   BMI 35.26 kg/m  General:   Alert,  pleasant and cooperative in NAD Head:  Normocephalic and atraumatic. Neck:  Supple; no masses or thyromegaly. Lungs:  Clear throughout to auscultation.    Heart:  Regular rate and rhythm. Abdomen:  Soft, nontender and nondistended. Normal bowel sounds, without guarding, and without rebound.   Neurologic:  Alert and  oriented x4;  grossly normal neurologically.  Impression/Plan: Paul Byrd is here for an endoscopy and colonoscopy to be performed for screening and preop gastric bypass.  Risks, benefits, limitations, and alternatives regarding  endoscopy and colonoscopy have been reviewed with the patient.  Questions have been answered.  All parties agreeable.   Paul Lame, MD  12/05/2017, 9:37 AM

## 2017-12-05 NOTE — Anesthesia Post-op Follow-up Note (Signed)
Anesthesia QCDR form completed.        

## 2017-12-05 NOTE — Transfer of Care (Signed)
Immediate Anesthesia Transfer of Care Note  Patient: Paul Byrd  Procedure(s) Performed: COLONOSCOPY WITH PROPOFOL (N/A ) ESOPHAGOGASTRODUODENOSCOPY (EGD) WITH PROPOFOL (N/A )  Patient Location: PACU and Endoscopy Unit  Anesthesia Type:General  Level of Consciousness: drowsy  Airway & Oxygen Therapy: Patient Spontanous Breathing and Patient connected to nasal cannula oxygen  Post-op Assessment: Report given to RN and Post -op Vital signs reviewed and stable  Post vital signs: Reviewed and stable  Last Vitals:  Vitals Value Taken Time  BP 94/66 12/05/2017 10:13 AM  Temp    Pulse 61 12/05/2017 10:14 AM  Resp 18 12/05/2017 10:14 AM  SpO2 96 % 12/05/2017 10:14 AM  Vitals shown include unvalidated device data.  Last Pain: There were no vitals filed for this visit.       Complications: No apparent anesthesia complications

## 2017-12-06 LAB — SURGICAL PATHOLOGY

## 2017-12-07 ENCOUNTER — Encounter: Payer: Self-pay | Admitting: Gastroenterology

## 2017-12-12 ENCOUNTER — Encounter: Payer: Self-pay | Admitting: Gastroenterology

## 2017-12-28 DIAGNOSIS — E669 Obesity, unspecified: Secondary | ICD-10-CM | POA: Insufficient documentation

## 2017-12-29 ENCOUNTER — Telehealth: Payer: Self-pay

## 2017-12-29 NOTE — Telephone Encounter (Signed)
Left vm for pt to return my call regarding procedure results.

## 2018-01-02 NOTE — Telephone Encounter (Signed)
Pt left vm to receive his results from [facility]

## 2018-01-03 ENCOUNTER — Other Ambulatory Visit: Payer: Self-pay

## 2018-01-03 NOTE — Telephone Encounter (Signed)
Pt notified of EGD and Colonoscopy results.

## 2018-03-23 ENCOUNTER — Other Ambulatory Visit: Payer: Self-pay

## 2018-03-23 ENCOUNTER — Telehealth: Payer: Self-pay

## 2018-03-23 DIAGNOSIS — Z8619 Personal history of other infectious and parasitic diseases: Secondary | ICD-10-CM

## 2018-03-23 NOTE — Telephone Encounter (Signed)
LVM for pt to return my call.

## 2018-03-23 NOTE — Telephone Encounter (Signed)
-----   Message from [place], [place] sent at 01/29/2018 11:08 AM EDT ----- Make sure pt gets rechecked for his h pylori. Pt aware.

## 2018-03-23 NOTE — Telephone Encounter (Signed)
Pt returned my call and order has been placed for pt to go to [facility] to have h pylori rechecked.

## 2018-04-11 LAB — H. PYLORI BREATH TEST: H PYLORI BREATH TEST: NEGATIVE

## 2018-04-12 ENCOUNTER — Telehealth: Payer: Self-pay

## 2018-04-12 NOTE — Telephone Encounter (Signed)
-----   Message from [name], MD sent at 04/12/2018  8:04 AM EST ----- Let the patient know that his H. Pylori breath test was Negative for infection

## 2018-04-12 NOTE — Telephone Encounter (Signed)
Left vm informing pt of negative h pylori breath test.

## 2018-05-02 DIAGNOSIS — N3281 Overactive bladder: Secondary | ICD-10-CM | POA: Insufficient documentation

## 2018-11-01 ENCOUNTER — Other Ambulatory Visit: Payer: Self-pay | Admitting: Internal Medicine

## 2018-11-01 DIAGNOSIS — M79651 Pain in right thigh: Secondary | ICD-10-CM

## 2018-11-08 ENCOUNTER — Other Ambulatory Visit: Payer: Self-pay

## 2018-11-08 ENCOUNTER — Ambulatory Visit
Admission: RE | Admit: 2018-11-08 | Discharge: 2018-11-08 | Disposition: A | Discharge status: Home / Self Care | Payer: BC Managed Care – PPO | Source: Ambulatory Visit | Attending: Internal Medicine | Admitting: Internal Medicine

## 2018-11-08 DIAGNOSIS — M79651 Pain in right thigh: Secondary | ICD-10-CM | POA: Insufficient documentation

## 2019-01-31 ENCOUNTER — Other Ambulatory Visit: Payer: Self-pay | Admitting: Orthopedic Surgery

## 2019-01-31 DIAGNOSIS — G8929 Other chronic pain: Secondary | ICD-10-CM

## 2019-01-31 DIAGNOSIS — M4807 Spinal stenosis, lumbosacral region: Secondary | ICD-10-CM

## 2019-02-13 ENCOUNTER — Other Ambulatory Visit: Payer: Self-pay

## 2019-02-13 ENCOUNTER — Ambulatory Visit
Admission: RE | Admit: 2019-02-13 | Discharge: 2019-02-13 | Disposition: A | Discharge status: Home / Self Care | Payer: BC Managed Care – PPO | Source: Ambulatory Visit | Attending: Orthopedic Surgery | Admitting: Orthopedic Surgery

## 2019-02-13 DIAGNOSIS — M4807 Spinal stenosis, lumbosacral region: Secondary | ICD-10-CM | POA: Diagnosis present

## 2019-02-13 DIAGNOSIS — M5441 Lumbago with sciatica, right side: Secondary | ICD-10-CM | POA: Diagnosis not present

## 2019-02-13 DIAGNOSIS — G8929 Other chronic pain: Secondary | ICD-10-CM | POA: Diagnosis present

## 2019-07-11 HISTORY — PX: ORIF ANKLE FRACTURE: SHX5408

## 2019-10-10 ENCOUNTER — Other Ambulatory Visit: Payer: Self-pay | Admitting: Internal Medicine

## 2019-10-10 ENCOUNTER — Other Ambulatory Visit (HOSPITAL_COMMUNITY): Payer: Self-pay | Admitting: Internal Medicine

## 2019-10-10 DIAGNOSIS — R42 Dizziness and giddiness: Secondary | ICD-10-CM

## 2019-10-10 DIAGNOSIS — S0990XD Unspecified injury of head, subsequent encounter: Secondary | ICD-10-CM

## 2019-10-17 ENCOUNTER — Ambulatory Visit
Admission: RE | Admit: 2019-10-17 | Discharge: 2019-10-17 | Disposition: A | Discharge status: Home / Self Care | Payer: BC Managed Care – PPO | Source: Ambulatory Visit | Attending: Internal Medicine | Admitting: Internal Medicine

## 2019-10-17 ENCOUNTER — Other Ambulatory Visit: Payer: Self-pay

## 2019-10-17 DIAGNOSIS — S0990XD Unspecified injury of head, subsequent encounter: Secondary | ICD-10-CM | POA: Diagnosis present

## 2019-10-17 DIAGNOSIS — R42 Dizziness and giddiness: Secondary | ICD-10-CM | POA: Diagnosis present

## 2019-12-13 HISTORY — PX: POSTERIOR LAMINECTOMY / DECOMPRESSION LUMBAR SPINE: SUR740

## 2020-03-30 ENCOUNTER — Other Ambulatory Visit: Payer: Self-pay | Admitting: Neurosurgery

## 2020-03-30 DIAGNOSIS — M542 Cervicalgia: Secondary | ICD-10-CM

## 2020-04-07 ENCOUNTER — Ambulatory Visit
Admission: RE | Admit: 2020-04-07 | Discharge: 2020-04-07 | Disposition: A | Discharge status: Home / Self Care | Payer: BC Managed Care – PPO | Source: Ambulatory Visit | Attending: Neurosurgery | Admitting: Neurosurgery

## 2020-04-07 ENCOUNTER — Other Ambulatory Visit: Payer: Self-pay

## 2020-04-07 DIAGNOSIS — M542 Cervicalgia: Secondary | ICD-10-CM | POA: Insufficient documentation

## 2020-09-22 ENCOUNTER — Other Ambulatory Visit: Payer: Self-pay

## 2020-09-22 ENCOUNTER — Encounter: Payer: Self-pay | Admitting: Occupational Therapy

## 2020-09-22 ENCOUNTER — Ambulatory Visit: Payer: BC Managed Care – PPO | Attending: Internal Medicine | Admitting: Occupational Therapy

## 2020-09-22 DIAGNOSIS — I890 Lymphedema, not elsewhere classified: Secondary | ICD-10-CM | POA: Diagnosis not present

## 2020-09-22 NOTE — Patient Instructions (Signed)
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
## 2020-09-29 ENCOUNTER — Ambulatory Visit: Payer: BC Managed Care – PPO | Admitting: Occupational Therapy

## 2020-09-29 ENCOUNTER — Other Ambulatory Visit: Payer: Self-pay

## 2020-09-29 DIAGNOSIS — I890 Lymphedema, not elsewhere classified: Secondary | ICD-10-CM | POA: Diagnosis not present

## 2020-09-29 NOTE — Therapy (Signed)
[place] MAIN [facility] SERVICES [address] [place], [place], [address] Phone: [phone]   Fax:  [phone]  Occupational Therapy Treatment  Patient Details  Name: Paul Byrd MRN: [id number] Date of Birth: 1950-12-11 Referring Provider (OT): Ronita Hipps, MD   Encounter Date: 09/29/2020   OT End of Session - 09/29/20 1301     Visit Number 2    Number of Visits 36    Date for OT Re-Evaluation 12/21/20    OT Start Time 0101    OT Stop Time 0211    OT Time Calculation (min) 70 min    Activity Tolerance Patient tolerated treatment well;No increased pain    Behavior During Therapy [facility] for tasks assessed/performed             Past Medical History:  Diagnosis Date   Arthritis    Cancer ([place]) 2001   kidney-left(surgical removal only)   Chronic kidney disease    nephrectomy left kidney-cancer   Headache(784.0)    rare now    Past Surgical History:  Procedure Laterality Date   COLONOSCOPY WITH PROPOFOL N/A 12/05/2017   Procedure: COLONOSCOPY WITH PROPOFOL;  Surgeon: [name], MD;  Location: [facility] ENDOSCOPY;  Service: Endoscopy;  Laterality: N/A;   ESOPHAGOGASTRODUODENOSCOPY (EGD) WITH PROPOFOL N/A 12/05/2017   Procedure: ESOPHAGOGASTRODUODENOSCOPY (EGD) WITH PROPOFOL;  Surgeon: [name], MD;  Location: [facility] ENDOSCOPY;  Service: Endoscopy;  Laterality: N/A;   HARDWARE REMOVAL Right 05/20/2014   Procedure: RIGHT HIP HARDWARE REMOVAL;  Surgeon: [name], MD;  Location: [facility] ORS;  Service: Orthopedics;  Laterality: Right;   JOINT REPLACEMENT     right hip x 2   NEPHRECTOMY  2001   left kidney for cancer   scar tissue removal     right chin   SHOULDER SURGERY     right x 2, left x 1   TONSILLECTOMY     TOTAL HIP ARTHROPLASTY Left 03/06/2013   Procedure: LEFT TOTAL HIP ARTHROPLASTY ANTERIOR APPROACH;  Surgeon: [name], MD;  Location: [facility] ORS;  Service: Orthopedics;  Laterality: Left;    There were no  vitals filed for this visit.   Subjective Assessment - 09/29/20 1307     Pertinent History Paul Byrd presents for OT visit 2/ 45 to commence skilled OT for Intensive Phase Complete Decongestive Therapy  for LLE lymphedema 2/2 venous trauama and insufficiency. Pt has no new complaints since initial evaluation last week. Pt reports pain in affected L leg and ankle at 5/10.    Limitations chronic L leg swelling and associated pain limits standing  and walking tolerance (20-30 minutes max), impairs balance, L>R,  causes difficulty fitting preferred street shoesand LB clothing    Repetition Increases Symptoms    Special Tests + Stemmer L base of toes    Patient Stated Goals "I know I'll have it forever, but I want to keep it to a minimum    Pain Onset --   07/16/2019                LYMPHEDEMA/ONCOLOGY QUESTIONNAIRE - 09/29/20 0001       Lymphedema Assessments   Lymphedema Assessments Lower extremities      Right Lower Extremity Lymphedema   Other RLE A-D volume 4381.7 ml. RLE E-G volume = 6308.4 ml. RLE full limb volume (A-G) = 10690.1 ml.      Left Lower Extremity Lymphedema   Other LLE A-D volume= 4693.9 ml. LLE E-G voue= 6881.6 ml.  LLE A-G volume = 11575.5 ml.    Other A-D limb volume differential (LVD) = 6.65%, L>R. E-G LVD= 8.3%, L>R, and full limb LVD for A-G =7.6%, L>R.                     OT Treatments/Exercises (OP) - 09/29/20 1552       ADLs   ADL Education Given Yes      Manual Therapy   Manual Therapy Edema management;Compression Bandaging    Edema Management BLE comparative limb volumetrics A-G    Compression Bandaging LLE knee length wraps using gradient techniques with 8.10 and 12 cm wide short stretch bandages over single layer cotton stockinet and 0.4 cm thick Rosidal foam.                    OT Education - 09/29/20 1558     Education Details Pt educated about rational for  comparative volume measurements , and intewrpretation  of his baseline data. Intro level obnly edu for short stretch vs long stretch  gradient wraps techniques from base of toes to below knee. Good return    Person(s) Educated Patient    Methods Explanation;Demonstration;Handout    Comprehension Verbalized understanding;Returned demonstration;Need further instruction                 OT Long Term Goals - 09/23/20 1302       OT LONG TERM GOAL #1   Title Pt modified independent with lymphedema precautions and prevention strategies using printed handout as reference to ID and give examples of at least 4 Precautions/ prevention principals - to limit LE progression and infection risk.    Baseline Max A    Time 4    Period Days    Status New    Target Date --   4th OT Rx visit     OT LONG TERM GOAL #2   Title Given this patient's risk-adjustment variables, Paul Byrd intake score on the Functional Outcom measure (FOTO) measures 53/100 . The higher     the score out of 100 indicates higher level of functional performance. Patient will experience at least an increase in function of 6 points by end of OT course.    Baseline 53/100    Time 12    Period Weeks    Status New    Target Date 12/21/20      OT LONG TERM GOAL #3   Title Pt will be able to independently apply multi-layer, short stretch compression wraps to one leg at a time using correct gradient techniques in an effort  to return the affected  limb(s)  to premorbid size and shape, to limit pain and infection risk, and to improve functional mobility for ADLs.    Baseline dependent    Time 4    Period Days    Status New    Target Date --   4th OT Rx session     OT LONG TERM GOAL #4   Title Pt will achieve at least 10% limb volume reduction in LLE during Intensive Phase CDT to prevent re-accumulation of lymphatic congestion and progression of fibrosis, to limit infection risk, to improve functional ambulation and transfers, and to improve functional performance of basic and  instrumental ADLs, and to limit LE progression.    Baseline Max A    Time 12    Period Weeks    Status New    Target Date 12/21/20  OT LONG TERM GOAL #5   Title Pt will achieve and sustain a least 85% compliance with all LE self-care home program components throughout Intensive Phase CDT, including elevation when seated, recommended skin care regime, lymphatic pumping ther ex, 23/7 compression wraps and simple self-MLD. Max caregiver assistance is necessary to ensure optimal limb volume reduction, to limit infection risk and to limit LE progression.    Baseline Max A    Time 12    Period Weeks    Status New    Target Date 12/21/20      Long Term Additional Goals   Additional Long Term Goals Yes      OT LONG TERM GOAL #6   Title By DC from OT Pt will be able to don and doff appropriate daytime compression garments using assistive devices to limit lymphatic re-accumulation and LE progression with before transitioning to self-management phase of CDT.    Baseline Max A    Time 12    Period Weeks    Status New    Target Date 12/21/20                   Plan - 09/29/20 1545     Clinical Impression Statement Initial BLE comparative limb volumetrics reveal limb volume differential of legs from ankle to  tibial tuberosity (A-D) of 6.65%, R>L. This is expected since L leg is  not ony the Rx leg, but also the dominant leg. Surprisingly       the L thigh from knee to groin (E-G) measures 8.3% larger on the L than the R. The LVD for full leg volume measures 7.6%, L>R. Applied LLE multilayer compression wraps to LLE from base of toes to tibial tuberosity using  1 each 8, 10 and 12 cm wide short stretch bandage over cotton stockinet and then 0.4 cm thich Rosidal foam . Provided Pt edu for using low resting pressure wraps to aid musce pump instead of tight , long stretch wraps. Good return. Productive visit. Cnt as epr POC.    OT Occupational Profile and History Comprehensive Assessment-  Review of records and extensive additional review of physical, cognitive, psychosocial history related to current functional performance    Occupational performance deficits (Please refer to evaluation for details): ADL's;IADL's;Work;Leisure;Social Participation    Body Structure / Function / Physical Skills ADL;Edema;Skin integrity;Balance;Mobility;Pain;Decreased knowledge of precautions;ROM;Decreased knowledge of use of DME;Scar mobility;IADL    Rehab Potential Good    Clinical Decision Making Several treatment options, min-mod task modification necessary    Comorbidities Affecting Occupational Performance: Presence of comorbidities impacting occupational performance    Comorbidities impacting occupational performance description: see SUBJECTIVE    Modification or Assistance to Complete Evaluation  Min-Moderate modification of tasks or assist with assess necessary to complete eval    OT Frequency 2x / week    OT Duration 12 weeks    OT Treatment/Interventions Self-care/ADL training;Therapeutic exercise;Manual Therapy;Manual lymph drainage;Therapeutic activities;Coping strategies training;DME and/or AE instruction;Compression bandaging;Patient/family education    Plan Complete Decongestive Therapy (CDT) to LLE: MLD, skin care, ther ex and compression - initially utilize short stretch ompression wraps to reduce volume, then fit with appropriate compression garment. Consider full length garment as swelling in L thigh is obvious.    OT Home Exercise Plan lymphatic pumping ther ex- 2 sets of 10 reps each exercise in order-bilaterally    Recommended Other Services Consider fitting with Flexitouch advanced sequential pneumatic compression device   to assist with long term LE self management  at home.    Consulted and Agree with Plan of Care Patient             Patient will benefit from skilled therapeutic intervention in order to improve the following deficits and impairments:   Body Structure /  Function / Physical Skills: ADL, Edema, Skin integrity, Balance, Mobility, Pain, Decreased knowledge of precautions, ROM, Decreased knowledge of use of DME, Scar mobility, IADL       Visit Diagnosis: Lymphedema, not elsewhere classified    Problem List Patient Active Problem List   Diagnosis Date Noted   Pre-operative exam    Duodenitis    Acute gastritis without hemorrhage    Encounter for screening colonoscopy    Benign neoplasm of ascending colon    Painful orthopaedic hardware ([place]) 05/20/2014   OA (osteoarthritis) of hip 03/06/2013   Andrey Spearman, MS, OTR/L, [facility] 09/29/20 4:01 PM    [place] [address] [place], [place], [address] Phone: [phone]   Fax:  [phone]  Name: AMY BELLOSO MRN: [id number] Date of Birth: 02/27/1951

## 2020-09-29 NOTE — Patient Instructions (Signed)

## 2020-10-06 ENCOUNTER — Ambulatory Visit: Payer: BC Managed Care – PPO | Admitting: Occupational Therapy

## 2020-10-06 ENCOUNTER — Other Ambulatory Visit: Payer: Self-pay

## 2020-10-06 DIAGNOSIS — I890 Lymphedema, not elsewhere classified: Secondary | ICD-10-CM

## 2020-10-06 NOTE — Therapy (Signed)
[place] MAIN [facility] SERVICES [address] [place], [place], [address] Phone: [phone]   Fax:  [phone]  Occupational Therapy Treatment  Patient Details  Name: Paul Byrd MRN: [id number] Date of Birth: Oct 08, 1950 Referring Provider (OT): Ronita Hipps, MD   Encounter Date: 10/06/2020   OT End of Session - 10/06/20 4076     Visit Number 3    Number of Visits 36    Date for OT Re-Evaluation 12/21/20    OT Start Time 0100    OT Stop Time 0203    OT Time Calculation (min) 63 min    Activity Tolerance Patient tolerated treatment well;No increased pain    Behavior During Therapy [facility] for tasks assessed/performed             Past Medical History:  Diagnosis Date   Arthritis    Cancer ([place]) 2001   kidney-left(surgical removal only)   Chronic kidney disease    nephrectomy left kidney-cancer   Headache(784.0)    rare now    Past Surgical History:  Procedure Laterality Date   COLONOSCOPY WITH PROPOFOL N/A 12/05/2017   Procedure: COLONOSCOPY WITH PROPOFOL;  Surgeon: [name], MD;  Location: [facility] ENDOSCOPY;  Service: Endoscopy;  Laterality: N/A;   ESOPHAGOGASTRODUODENOSCOPY (EGD) WITH PROPOFOL N/A 12/05/2017   Procedure: ESOPHAGOGASTRODUODENOSCOPY (EGD) WITH PROPOFOL;  Surgeon: [name], MD;  Location: [facility] ENDOSCOPY;  Service: Endoscopy;  Laterality: N/A;   HARDWARE REMOVAL Right 05/20/2014   Procedure: RIGHT HIP HARDWARE REMOVAL;  Surgeon: [name], MD;  Location: [facility];  Service: Orthopedics;  Laterality: Right;   JOINT REPLACEMENT     right hip x 2   NEPHRECTOMY  2001   left kidney for cancer   scar tissue removal     right chin   SHOULDER SURGERY     right x 2, left x 1   TONSILLECTOMY     TOTAL HIP ARTHROPLASTY Left 03/06/2013   Procedure: LEFT TOTAL HIP ARTHROPLASTY ANTERIOR APPROACH;  Surgeon: [name], MD;  Location: [facility];  Service: Orthopedics;  Laterality: Left;    There were no  vitals filed for this visit.   Subjective Assessment - 10/06/20 1258     Pertinent History Paul Byrd presents for OT visit 3 36 to address LLE lymphedema  2/2 venous trauama and insufficiency. Pt has no new complaints today. LE related pain is unchanged.    Limitations chronic L leg swelling and associated pain limits standing  and walking tolerance (20-30 minutes max), impairs balance, L>R,  causes difficulty fitting preferred street shoesand LB clothing    Repetition Increases Symptoms    Special Tests + Stemmer L base of toes    Patient Stated Goals "I know I'll have it forever, but I want to keep it to a minimum    Pain Onset --   07/16/2019                         OT Treatments/Exercises (OP) - 10/06/20 1616       ADLs   ADL Education Given Yes      Manual Therapy   Manual Therapy Edema management;Compression Bandaging    Compression Bandaging LLE knee length wraps using gradient techniques with 8.10 and 12 cm wide short stretch bandages over single layer cotton stockinet and 0.4 cm thick Rosidal foam.  OT Education - 10/06/20 1617     Education Details Pt edu for home program- gradient compression bandaging to LLE    Person(s) Educated Patient    Methods Explanation;Demonstration;Handout    Comprehension Verbalized understanding;Returned demonstration;Need further instruction                 OT Long Term Goals - 09/23/20 1302       OT LONG TERM GOAL #1   Title Pt modified independent with lymphedema precautions and prevention strategies using printed handout as reference to ID and give examples of at least 4 Precautions/ prevention principals - to limit LE progression and infection risk.    Baseline Max A    Time 4    Period Days    Status New    Target Date --   4th OT Rx visit     OT LONG TERM GOAL #2   Title Given this patient's risk-adjustment variables, Paul Byrd intake score on the Functional Outcom  measure (FOTO) measures 53/100 . The higher     the score out of 100 indicates higher level of functional performance. Patient will experience at least an increase in function of 6 points by end of OT course.    Baseline 53/100    Time 12    Period Weeks    Status New    Target Date 12/21/20      OT LONG TERM GOAL #3   Title Pt will be able to independently apply multi-layer, short stretch compression wraps to one leg at a time using correct gradient techniques in an effort  to return the affected  limb(s)  to premorbid size and shape, to limit pain and infection risk, and to improve functional mobility for ADLs.    Baseline dependent    Time 4    Period Days    Status New    Target Date --   4th OT Rx session     OT LONG TERM GOAL #4   Title Pt will achieve at least 10% limb volume reduction in LLE during Intensive Phase CDT to prevent re-accumulation of lymphatic congestion and progression of fibrosis, to limit infection risk, to improve functional ambulation and transfers, and to improve functional performance of basic and instrumental ADLs, and to limit LE progression.    Baseline Max A    Time 12    Period Weeks    Status New    Target Date 12/21/20      OT LONG TERM GOAL #5   Title Pt will achieve and sustain a least 85% compliance with all LE self-care home program components throughout Intensive Phase CDT, including elevation when seated, recommended skin care regime, lymphatic pumping ther ex, 23/7 compression wraps and simple self-MLD. Max caregiver assistance is necessary to ensure optimal limb volume reduction, to limit infection risk and to limit LE progression.    Baseline Max A    Time 12    Period Weeks    Status New    Target Date 12/21/20      Long Term Additional Goals   Additional Long Term Goals Yes      OT LONG TERM GOAL #6   Title By DC from OT Pt will be able to don and doff appropriate daytime compression garments using assistive devices to limit lymphatic  re-accumulation and LE progression with before transitioning to self-management phase of CDT.    Baseline Max A    Time 12    Period Weeks  Status New    Target Date 12/21/20                   Plan - 10/06/20 1613     Clinical Impression Statement Emphasis of visit on teaching lymphedema self-care home program for bandaging today. By end of visit Pt able to apply knee length, multi-layer, short stretch wraps using gradient techniques with mod A. Next visit we'll commence MLD in clinic. Pt will prqactice compression wrapping during visit interval. Cont as per OC.    OT Occupational Profile and History Comprehensive Assessment- Review of records and extensive additional review of physical, cognitive, psychosocial history related to current functional performance    Occupational performance deficits (Please refer to evaluation for details): ADL's;IADL's;Work;Leisure;Social Participation    Body Structure / Function / Physical Skills ADL;Edema;Skin integrity;Balance;Mobility;Pain;Decreased knowledge of precautions;ROM;Decreased knowledge of use of DME;Scar mobility;IADL    Rehab Potential Good    Clinical Decision Making Several treatment options, min-mod task modification necessary    Comorbidities Affecting Occupational Performance: Presence of comorbidities impacting occupational performance    Comorbidities impacting occupational performance description: see SUBJECTIVE    Modification or Assistance to Complete Evaluation  Min-Moderate modification of tasks or assist with assess necessary to complete eval    OT Frequency 2x / week    OT Duration 12 weeks    OT Treatment/Interventions Self-care/ADL training;Therapeutic exercise;Manual Therapy;Manual lymph drainage;Therapeutic activities;Coping strategies training;DME and/or AE instruction;Compression bandaging;Patient/family education    Plan Complete Decongestive Therapy (CDT) to LLE: MLD, skin care, ther ex and compression -  initially utilize short stretch ompression wraps to reduce volume, then fit with appropriate compression garment. Consider full length garment as swelling in L thigh is obvious.    OT Home Exercise Plan lymphatic pumping ther ex- 2 sets of 10 reps each exercise in order-bilaterally    Recommended Other Services Consider fitting with Flexitouch advanced sequential pneumatic compression device   to assist with long term LE self management at home.    Consulted and Agree with Plan of Care Patient             Patient will benefit from skilled therapeutic intervention in order to improve the following deficits and impairments:   Body Structure / Function / Physical Skills: ADL, Edema, Skin integrity, Balance, Mobility, Pain, Decreased knowledge of precautions, ROM, Decreased knowledge of use of DME, Scar mobility, IADL       Visit Diagnosis: Lymphedema, not elsewhere classified    Problem List Patient Active Problem List   Diagnosis Date Noted   Pre-operative exam    Duodenitis    Acute gastritis without hemorrhage    Encounter for screening colonoscopy    Benign neoplasm of ascending colon    Painful orthopaedic hardware ([place]) 05/20/2014   OA (osteoarthritis) of hip 03/06/2013   Andrey Spearman, MS, OTR/L, [facility] 10/06/20 4:18 PM    [place] [address] [place], [place], [address] Phone: [phone]   Fax:  [phone]  Name: Paul Byrd MRN: [id number] Date of Birth: 1950-09-08

## 2020-10-19 ENCOUNTER — Ambulatory Visit: Payer: BC Managed Care – PPO | Admitting: Occupational Therapy

## 2020-10-21 ENCOUNTER — Ambulatory Visit: Payer: BC Managed Care – PPO | Attending: Internal Medicine | Admitting: Occupational Therapy

## 2020-10-21 ENCOUNTER — Other Ambulatory Visit: Payer: Self-pay

## 2020-10-21 DIAGNOSIS — I890 Lymphedema, not elsewhere classified: Secondary | ICD-10-CM | POA: Diagnosis present

## 2020-10-21 NOTE — Therapy (Signed)
[place] MAIN [facility] SERVICES [address] [place], [place], [address] Phone: [phone]   Fax:  [phone]  Occupational Therapy Treatment  Patient Details  Name: Paul Byrd MRN: [id number] Date of Birth: 17-May-1950 Referring Provider (OT): Ronita Hipps, MD   Encounter Date: 10/21/2020   OT End of Session - 10/21/20 1007     Visit Number 4    Number of Visits 36    Date for OT Re-Evaluation 12/21/20    OT Start Time 1007    OT Stop Time 1100    OT Time Calculation (min) 53 min    Activity Tolerance Patient tolerated treatment well;No increased pain             Past Medical History:  Diagnosis Date   Arthritis    Cancer ([place]) 2001   kidney-left(surgical removal only)   Chronic kidney disease    nephrectomy left kidney-cancer   Headache(784.0)    rare now    Past Surgical History:  Procedure Laterality Date   COLONOSCOPY WITH PROPOFOL N/A 12/05/2017   Procedure: COLONOSCOPY WITH PROPOFOL;  Surgeon: Paul Lame, MD;  Location: [facility] ENDOSCOPY;  Service: Endoscopy;  Laterality: N/A;   ESOPHAGOGASTRODUODENOSCOPY (EGD) WITH PROPOFOL N/A 12/05/2017   Procedure: ESOPHAGOGASTRODUODENOSCOPY (EGD) WITH PROPOFOL;  Surgeon: Paul Lame, MD;  Location: [facility] ENDOSCOPY;  Service: Endoscopy;  Laterality: N/A;   HARDWARE REMOVAL Right 05/20/2014   Procedure: RIGHT HIP HARDWARE REMOVAL;  Surgeon: Paul Alf, MD;  Location: [facility] ORS;  Service: Orthopedics;  Laterality: Right;   JOINT REPLACEMENT     right hip x 2   NEPHRECTOMY  2001   left kidney for cancer   scar tissue removal     right chin   SHOULDER SURGERY     right x 2, left x 1   TONSILLECTOMY     TOTAL HIP ARTHROPLASTY Left 03/06/2013   Procedure: LEFT TOTAL HIP ARTHROPLASTY ANTERIOR APPROACH;  Surgeon: Paul Alf, MD;  Location: [facility] ORS;  Service: Orthopedics;  Laterality: Left;    There were no vitals filed for this visit.   Subjective Assessment - 10/21/20  1027     Subjective  SPENSER Byrd presents for OT visit 4/36 to address LLE lymphedema  2/2 venous trauama and insufficiency. Pt reports ankle pain at 3/10. Pt reports he fell yesterday when climbing over pet gate in his home. He denies injury. 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    Pertinent History OA, chronic kidney disease, painful orthopedic hardware (l ANKLE)    Limitations chronic L leg swelling and associated pain limits standing  and walking tolerance (20-30 minutes max), impairs balance, L>R,  causes difficulty fitting preferred street shoesand LB clothing    Repetition Increases Symptoms    Special Tests + Stemmer L base of toes    Patient Stated Goals "I know I'll have it forever, but I want to keep it to a minimum    Pain Onset --   07/16/2019                         OT Treatments/Exercises (OP) - 10/21/20 1212       ADLs   ADL Education Given Yes      Manual Therapy   Manual Therapy Edema management;Compression Bandaging  Edema Management Trial with Flexitouch advanced sequential pneumatic device , or "pump"    Compression Bandaging LLE knee length wraps using gradient techniques with 8.10 and 12 cm wide short stretch bandages over single layer cotton stockinet and 0.4 cm thick Rosidal foam.                    OT Education - 10/21/20 1037     Education Details Pt and family education for Flexitouch advanced sequential pneumatic compression device, or "pump", including how it works, indications, contraindications, precautions, care and use routine. Patient's questions were answered throughout trial and handouts and Internet resources given for reference.    Person(s) Educated Patient    Methods Explanation;Demonstration;Handout    Comprehension  Verbalized understanding;Returned demonstration;Need further instruction                 OT Long Term Goals - 09/23/20 1302       OT LONG TERM GOAL #1   Title Pt modified independent with lymphedema precautions and prevention strategies using printed handout as reference to ID and give examples of at least 4 Precautions/ prevention principals - to limit LE progression and infection risk.    Baseline Max A    Time 4    Period Days    Status New    Target Date --   4th OT Rx visit     OT LONG TERM GOAL #2   Title Given this patient's risk-adjustment variables, Mr. Guice intake score on the Functional Outcom measure (FOTO) measures 53/100 . The higher     the score out of 100 indicates higher level of functional performance. Patient will experience at least an increase in function of 6 points by end of OT course.    Baseline 53/100    Time 12    Period Weeks    Status New    Target Date 12/21/20      OT LONG TERM GOAL #3   Title Pt will be able to independently apply multi-layer, short stretch compression wraps to one leg at a time using correct gradient techniques in an effort  to return the affected  limb(s)  to premorbid size and shape, to limit pain and infection risk, and to improve functional mobility for ADLs.    Baseline dependent    Time 4    Period Days    Status New    Target Date --   4th OT Rx session     OT LONG TERM GOAL #4   Title Pt will achieve at least 10% limb volume reduction in LLE during Intensive Phase CDT to prevent re-accumulation of lymphatic congestion and progression of fibrosis, to limit infection risk, to improve functional ambulation and transfers, and to improve functional performance of basic and instrumental ADLs, and to limit LE progression.    Baseline Max A    Time 12    Period Weeks    Status New    Target Date 12/21/20      OT LONG TERM GOAL #5   Title Pt will achieve and sustain a least 85% compliance with all LE self-care home  program components throughout Intensive Phase CDT, including elevation when seated, recommended skin care regime, lymphatic pumping ther ex, 23/7 compression wraps and simple self-MLD. Max caregiver assistance is necessary to ensure optimal limb volume reduction, to limit infection risk and to limit LE progression.    Baseline Max A    Time 12    Period Weeks  Status New    Target Date 12/21/20      Long Term Additional Goals   Additional Long Term Goals Yes      OT LONG TERM GOAL #6   Title By DC from OT Pt will be able to don and doff appropriate daytime compression garments using assistive devices to limit lymphatic re-accumulation and LE progression with before transitioning to self-management phase of CDT.    Baseline Max A    Time 12    Period Weeks    Status New    Target Date 12/21/20                   Plan - 10/21/20 1211     Clinical Impression Statement NETHANIEL MATTIE presents with mild, stage II, LLE/ LLQ lymphedema secondary to chronic venous insufficiency and obesity. Mr. Kalisz has attempted to reduce limb volume and associated tissue fibrosis using conservative measures, including elevation and off the shelf compression garments. He is currently undergoing Complete Decongestive Therapy (CDT) 2 x weekly to reduce limb volume and improve tissue flexibility. To date Ronalee Belts has achieved slight limb volume reduction, however, tissue hyperplasia persists below the knees and at lateral ankle; Today Pt tolerated 1 hr. trial utilizing the [employer] advanced Flexitouch sequential pneumatic compression device utilizing 30-40 mmHg compression to LLE without increased pain and shortness of breath. Unlike a basic pneumatic compression device the 32-chamber Flexitouch is an advanced sequential pneumatic compression devices providing proximal to distal lymphatic fluid return via regional lymph nodes, (LN) deep abdominal pathways and the thoracic duct to the heart. The basic  pneumatic pump is not appropriate for this patient because it provides distal-to-proximal, retrograde massage mobilizing tissue fluid against back pressure which then abruptly ends before reaching regional LNs leaving dense, protein rich fluid distal to the lymph nodes at joints increasing pain. The Flexitouch pneumatic compression device is medically necessary for this long term daily in-home use to help manage symptoms of chronic, progressive lymphedema on a daily basis to limit progression and further functional decline. ALast 15  minutes of session re-taught gradient cmpression wrapping. Cont as per POC.    OT Occupational Profile and History Comprehensive Assessment- Review of records and extensive additional review of physical, cognitive, psychosocial history related to current functional performance    Occupational performance deficits (Please refer to evaluation for details): ADL's;IADL's;Work;Leisure;Social Participation    Body Structure / Function / Physical Skills ADL;Edema;Skin integrity;Balance;Mobility;Pain;Decreased knowledge of precautions;ROM;Decreased knowledge of use of DME;Scar mobility;IADL    Rehab Potential Good    Clinical Decision Making Several treatment options, min-mod task modification necessary    Comorbidities Affecting Occupational Performance: Presence of comorbidities impacting occupational performance    Comorbidities impacting occupational performance description: see SUBJECTIVE    Modification or Assistance to Complete Evaluation  Min-Moderate modification of tasks or assist with assess necessary to complete eval    OT Frequency 2x / week    OT Duration 12 weeks    OT Treatment/Interventions Self-care/ADL training;Therapeutic exercise;Manual Therapy;Manual lymph drainage;Therapeutic activities;Coping strategies training;DME and/or AE instruction;Compression bandaging;Patient/family education    Plan Complete Decongestive Therapy (CDT) to LLE: MLD, skin care, ther ex  and compression - initially utilize short stretch ompression wraps to reduce volume, then fit with appropriate compression garment. Consider full length garment as swelling in L thigh is obvious.    OT Home Exercise Plan lymphatic pumping ther ex- 2 sets of 10 reps each exercise in order-bilaterally    Recommended Other Services Consider fitting  with Flexitouch advanced sequential pneumatic compression device   to assist with long term LE self management at home.    Consulted and Agree with Plan of Care Patient             Patient will benefit from skilled therapeutic intervention in order to improve the following deficits and impairments:   Body Structure / Function / Physical Skills: ADL, Edema, Skin integrity, Balance, Mobility, Pain, Decreased knowledge of precautions, ROM, Decreased knowledge of use of DME, Scar mobility, IADL       Visit Diagnosis: Lymphedema, not elsewhere classified    Problem List Patient Active Problem List   Diagnosis Date Noted   Pre-operative exam    Duodenitis    Acute gastritis without hemorrhage    Encounter for screening colonoscopy    Benign neoplasm of ascending colon    Painful orthopaedic hardware ([place]) 05/20/2014   OA (osteoarthritis) of hip 03/06/2013    Andrey Spearman, MS, OTR/L, [facility] 10/21/20 12:14 PM    [place] [address] [place], [place], [address] Phone: [phone]   Fax:  [phone]  Name: JERROL HELMERS MRN: [id number] Date of Birth: 1950-04-25

## 2020-10-21 NOTE — Patient Instructions (Signed)
Lymphedema Self- Care Instructions  1. EXERCISE: Perform lymphatic pumping there ex 2 x a day. While wearing your compression wraps or garments. Perform 10 reps of each exercise bilaterally and be sure to perform them in order. Don;t skip around!  OMIT PARTIAL SIT UPs.  2. MLD: Perform simple self-Manual Lymphatic Drainage (MLD) at least once a day as directed.  3. If you have a Flexitouch advanced "pump" use it 1 time each day on a single limb only. The Flexitouch moves lymphatic fluid out of your affected body part and back to your heart, so DO NOT use the Flexi on 2 legs at a time, and DO NOT ues it on 2 legs on the same day. If you experience any atypical shortness of breath, sudden onset of pain, or feelings of heart arhythmia, or racing, discontinue use of the Flexitouch and report these symptoms to your doctor right away.  4. WRAPS: Compression wraps are to be worn 23 hrs/ 7 days/wk during Intensive Phase of Complete Decongestive Therapy (CDT).Building tolerance may take time and practice, so don't get discouraged. If bandages begin to feel tight during periods of inactivity and/or during the night, try performing your exercises to loosen them.   5. GARMENTS: During Management Phase CDT your compression garments are to be worn during waking hours when active. Do NOT sleep in your garments!!   6. PUT YOUR FEET UP! Elevate your feet and legs and feet to the level of your heart whenever you are sitting down.   7. SKIN: Carefully monitor skin condition and perform impeccable hygiene daily. Bathe skin with mild soap and water and apply low pH lotion (aka Eucerin ) to improve hydration and limit infection risk.

## 2020-10-27 ENCOUNTER — Ambulatory Visit: Payer: BC Managed Care – PPO | Admitting: Occupational Therapy

## 2020-11-03 ENCOUNTER — Other Ambulatory Visit: Payer: Self-pay

## 2020-11-03 ENCOUNTER — Ambulatory Visit: Payer: BC Managed Care – PPO | Admitting: Occupational Therapy

## 2020-11-03 DIAGNOSIS — I890 Lymphedema, not elsewhere classified: Secondary | ICD-10-CM

## 2020-11-03 NOTE — Therapy (Signed)
[place] MAIN [facility] SERVICES [address] [place], [place], [address] Phone: [phone]   Fax:  [phone]  Occupational Therapy Treatment  Patient Details  Name: Paul Byrd MRN: [id number] Date of Birth: [birth year] Referring Provider (OT): Ronita Hipps, MD   Encounter Date: 11/03/2020   OT End of Session - 11/03/20 1111     Visit Number 5    Number of Visits 36    Date for OT Re-Evaluation 12/21/20    OT Start Time 1105    OT Stop Time 1210    OT Time Calculation (min) 65 min    Equipment Utilized During Treatment kinesiotape    Activity Tolerance Patient tolerated treatment well;No increased pain             Past Medical History:  Diagnosis Date   Arthritis    Cancer ([place]) 2001   kidney-left(surgical removal only)   Chronic kidney disease    nephrectomy left kidney-cancer   Headache(784.0)    rare now    Past Surgical History:  Procedure Laterality Date   COLONOSCOPY WITH PROPOFOL N/A 12/05/2017   Procedure: COLONOSCOPY WITH PROPOFOL;  Surgeon: [name], MD;  Location: [facility] ENDOSCOPY;  Service: Endoscopy;  Laterality: N/A;   ESOPHAGOGASTRODUODENOSCOPY (EGD) WITH PROPOFOL N/A 12/05/2017   Procedure: ESOPHAGOGASTRODUODENOSCOPY (EGD) WITH PROPOFOL;  Surgeon: [name], MD;  Location: [facility] ENDOSCOPY;  Service: Endoscopy;  Laterality: N/A;   HARDWARE REMOVAL Right 05/20/2014   Procedure: RIGHT HIP HARDWARE REMOVAL;  Surgeon: [name], MD;  Location: [facility]S;  Service: Orthopedics;  Laterality: Right;   JOINT REPLACEMENT     right hip x 2   NEPHRECTOMY  2001   left kidney for cancer   scar tissue removal     right chin   SHOULDER SURGERY     right x 2, left x 1   TONSILLECTOMY     TOTAL HIP ARTHROPLASTY Left 03/06/2013   Procedure: LEFT TOTAL HIP ARTHROPLASTY ANTERIOR APPROACH;  Surgeon: [name], MD;  Location: [facility] ORS;  Service: Orthopedics;  Laterality: Left;    There were no vitals filed  for this visit.   Subjective Assessment - 11/03/20 1521     Subjective  Paul Byrd presents for OT visit 6/36 to address LLE lymphedema  2/2 venous trauama and insufficiency. Pt reports 1/10 ankle pain. Pt reports he has noticed some bruising on the lateral inferor maleolus. Pt knows of no reason why bruising would be appearing.    Pertinent History OA, chronic kidney disease, painful orthopedic hardware (l ANKLE)    Limitations chronic L leg swelling and associated pain limits standing  and walking tolerance (20-30 minutes max), impairs balance, L>R,  causes difficulty fitting preferred street shoesand LB clothing    Repetition Increases Symptoms    Special Tests + Stemmer L base of toes    Patient Stated Goals "I know I'll have it forever, but I want to keep it to a minimum    Pain Onset --   07/16/2019                         OT Treatments/Exercises (OP) - 11/03/20 1521       ADLs   ADL Education Given Yes      Manual Therapy   Manual Therapy Edema management;Compression Bandaging    Compression Bandaging LLE knee length wraps using gradient techniques with 8.10 and 12 cm wide short stretch bandages over  single layer cotton stockinet and 0.4 cm thick Rosidal foam.                    OT Education - 11/03/20 1522     Education Details Continued skilled Pt/caregiver education  And LE ADL training throughout visit for lymphedema self care/ home program, including compression wrapping, compression garment and device wear/care, lymphatic pumping ther ex, simple self-MLD, and skin care. Discussed initial volumetric measurements. Discussed all long term goals.    Person(s) Educated Patient    Methods Explanation;Demonstration;Handout    Comprehension Verbalized understanding;Returned demonstration;Need further instruction                 OT Long Term Goals - 09/23/20 1302       OT LONG TERM GOAL #1   Title Pt modified independent with lymphedema  precautions and prevention strategies using printed handout as reference to ID and give examples of at least 4 Precautions/ prevention principals - to limit LE progression and infection risk.    Baseline Max A    Time 4    Period Days    Status New    Target Date --   4th OT Rx visit     OT LONG TERM GOAL #2   Title Given this patient's risk-adjustment variables, Mr. Talk intake score on the Functional Outcom measure (FOTO) measures 53/100 . The higher     the score out of 100 indicates higher level of functional performance. Patient will experience at least an increase in function of 6 points by end of OT course.    Baseline 53/100    Time 12    Period Weeks    Status New    Target Date 12/21/20      OT LONG TERM GOAL #3   Title Pt will be able to independently apply multi-layer, short stretch compression wraps to one leg at a time using correct gradient techniques in an effort  to return the affected  limb(s)  to premorbid size and shape, to limit pain and infection risk, and to improve functional mobility for ADLs.    Baseline dependent    Time 4    Period Days    Status New    Target Date --   4th OT Rx session     OT LONG TERM GOAL #4   Title Pt will achieve at least 10% limb volume reduction in LLE during Intensive Phase CDT to prevent re-accumulation of lymphatic congestion and progression of fibrosis, to limit infection risk, to improve functional ambulation and transfers, and to improve functional performance of basic and instrumental ADLs, and to limit LE progression.    Baseline Max A    Time 12    Period Weeks    Status New    Target Date 12/21/20      OT LONG TERM GOAL #5   Title Pt will achieve and sustain a least 85% compliance with all LE self-care home program components throughout Intensive Phase CDT, including elevation when seated, recommended skin care regime, lymphatic pumping ther ex, 23/7 compression wraps and simple self-MLD. Max caregiver assistance is  necessary to ensure optimal limb volume reduction, to limit infection risk and to limit LE progression.    Baseline Max A    Time 12    Period Weeks    Status New    Target Date 12/21/20      Long Term Additional Goals   Additional Long Term Goals Yes  OT LONG TERM GOAL #6   Title By DC from OT Pt will be able to don and doff appropriate daytime compression garments using assistive devices to limit lymphatic re-accumulation and LE progression with before transitioning to self-management phase of CDT.    Baseline Max A    Time 12    Period Weeks    Status New    Target Date 12/21/20                   Plan - 11/03/20 1516     Clinical Impression Statement Pt managing limb swelling well using compression wraps between sessions as instructed. He has done a nice job decongesting distal leg and ankle without over compressiong. We note slight increase in hemociderin stain inferior to L lateral maleolus. Provided MLD utilizing functional inguinal LN without increased pain. Included gentle fibrosis technique. Applied kinesiotape anchoring at lateral distal leg and anterior  distal leg and extending finger  cuts across anterior ankle and over dorsum of foot, and extending laterally over ankle. Kinesiotape used in effort to passively massage skin to enhance lymphatic function. Next visit complete anatmical measurements for custom compression garment- knee high. Cont as per POC.    OT Occupational Profile and History Comprehensive Assessment- Review of records and extensive additional review of physical, cognitive, psychosocial history related to current functional performance    Occupational performance deficits (Please refer to evaluation for details): ADL's;IADL's;Work;Leisure;Social Participation    Body Structure / Function / Physical Skills ADL;Edema;Skin integrity;Balance;Mobility;Pain;Decreased knowledge of precautions;ROM;Decreased knowledge of use of DME;Scar mobility;IADL     Rehab Potential Good    Clinical Decision Making Several treatment options, min-mod task modification necessary    Comorbidities Affecting Occupational Performance: Presence of comorbidities impacting occupational performance    Comorbidities impacting occupational performance description: see SUBJECTIVE    Modification or Assistance to Complete Evaluation  Min-Moderate modification of tasks or assist with assess necessary to complete eval    OT Frequency 2x / week    OT Duration 12 weeks    OT Treatment/Interventions Self-care/ADL training;Therapeutic exercise;Manual Therapy;Manual lymph drainage;Therapeutic activities;Coping strategies training;DME and/or AE instruction;Compression bandaging;Patient/family education    Plan Complete Decongestive Therapy (CDT) to LLE: MLD, skin care, ther ex and compression - initially utilize short stretch ompression wraps to reduce volume, then fit with appropriate compression garment. Consider full length garment as swelling in L thigh is obvious.    OT Home Exercise Plan lymphatic pumping ther ex- 2 sets of 10 reps each exercise in order-bilaterally    Recommended Other Services Consider fitting with Flexitouch advanced sequential pneumatic compression device   to assist with long term LE self management at home.    Consulted and Agree with Plan of Care Patient             Patient will benefit from skilled therapeutic intervention in order to improve the following deficits and impairments:   Body Structure / Function / Physical Skills: ADL, Edema, Skin integrity, Balance, Mobility, Pain, Decreased knowledge of precautions, ROM, Decreased knowledge of use of DME, Scar mobility, IADL       Visit Diagnosis: Lymphedema, not elsewhere classified    Problem List Patient Active Problem List   Diagnosis Date Noted   Pre-operative exam    Duodenitis    Acute gastritis without hemorrhage    Encounter for screening colonoscopy    Benign neoplasm of  ascending colon    Painful orthopaedic hardware ([place]) 05/20/2014   OA (osteoarthritis) of hip 03/06/2013  Andrey Spearman, MS, OTR/L, [facility] 11/03/20 3:24 PM   [place] MAIN [facility] SERVICES [address] [place], [place], [address] Phone: [phone]   Fax:  [phone]  Name: TREAVOR UN MRN: [id number] Date of Birth: 14-Jul-1950

## 2020-11-10 ENCOUNTER — Other Ambulatory Visit: Payer: Self-pay

## 2020-11-10 ENCOUNTER — Ambulatory Visit: Payer: BC Managed Care – PPO | Attending: Internal Medicine | Admitting: Occupational Therapy

## 2020-11-10 DIAGNOSIS — I890 Lymphedema, not elsewhere classified: Secondary | ICD-10-CM | POA: Diagnosis present

## 2020-11-10 NOTE — Therapy (Signed)
[place] MAIN [facility] SERVICES [address] [place], [place], [address] Phone: [phone]   Fax:  [phone]  Occupational Therapy Treatment  Patient Details  Name: TENNISON Byrd MRN: [id number] Date of Birth: February 05, 1951 Referring Provider (OT): Paul Hipps, MD   Encounter Date: 11/10/2020   OT End of Session - 11/10/20 1120     Visit Number 6    Number of Visits 36    Date for OT Re-Evaluation 12/21/20    OT Start Time 1105    OT Stop Time 1205    OT Time Calculation (min) 60 min    Equipment Utilized During Treatment [email or web address]    Activity Tolerance Patient tolerated treatment well;No increased pain             Past Medical History:  Diagnosis Date   Arthritis    Cancer ([place]) 2001   kidney-left(surgical removal only)   Chronic kidney disease    nephrectomy left kidney-cancer   Headache(784.0)    rare now    Past Surgical History:  Procedure Laterality Date   COLONOSCOPY WITH PROPOFOL N/A 12/05/2017   Procedure: COLONOSCOPY WITH PROPOFOL;  Surgeon: Paul Lame, MD;  Location: [facility] ENDOSCOPY;  Service: Endoscopy;  Laterality: N/A;   ESOPHAGOGASTRODUODENOSCOPY (EGD) WITH PROPOFOL N/A 12/05/2017   Procedure: ESOPHAGOGASTRODUODENOSCOPY (EGD) WITH PROPOFOL;  Surgeon: Paul Lame, MD;  Location: [facility] ENDOSCOPY;  Service: Endoscopy;  Laterality: N/A;   HARDWARE REMOVAL Right 05/20/2014   Procedure: RIGHT HIP HARDWARE REMOVAL;  Surgeon: Paul Alf, MD;  Location: [facility] ORS;  Service: Orthopedics;  Laterality: Right;   JOINT REPLACEMENT     right hip x 2   NEPHRECTOMY  2001   left kidney for cancer   scar tissue removal     right chin   SHOULDER SURGERY     right x 2, left x 1   TONSILLECTOMY     TOTAL HIP ARTHROPLASTY Left 03/06/2013   Procedure: LEFT TOTAL HIP ARTHROPLASTY ANTERIOR APPROACH;  Surgeon: Paul Alf, MD;  Location: [facility] ORS;  Service: Orthopedics;  Laterality: Left;    There were no vitals filed  for this visit.                 OT Treatments/Exercises (OP) - 11/10/20 1344       ADLs   ADL Education Given Yes      Manual Therapy   Manual Therapy Edema management;Compression Bandaging    Edema Management completed anatomical measurements for knee length, open toe, ccl 2 custom flat knit Elvarex compression stocking and for Jobst knee length RELAX for HOS    Compression Bandaging LLE knee length wraps using gradient techniques with 8.10 and 12 cm wide short stretch bandages over single layer cotton stockinet and 0.4 cm thick Rosidal foam.                    OT Education - 11/10/20 1346     Education Details Continued skilled Pt/caregiver education  And LE ADL training throughout visit for lymphedema self care/ home program, including compression wrapping, compression garment and device wear/care, lymphatic pumping ther ex, simple self-MLD, and skin care. Discussed initial volumetric measurements. Discussed all long term goals.    Person(s) Educated Patient    Methods Explanation;Demonstration;Handout    Comprehension Verbalized understanding;Returned demonstration;Need further instruction                 OT Long Term Goals - 09/23/20 1302  OT LONG TERM GOAL #1   Title Pt modified independent with lymphedema precautions and prevention strategies using printed handout as reference to ID and give examples of at least 4 Precautions/ prevention principals - to limit LE progression and infection risk.    Baseline Max A    Time 4    Period Days    Status New    Target Date --   4th OT Rx visit     OT LONG TERM GOAL #2   Title Given this patient's risk-adjustment variables, Mr. Brant intake score on the Functional Outcom measure (FOTO) measures 53/100 . The higher     the score out of 100 indicates higher level of functional performance. Patient will experience at least an increase in function of 6 points by end of OT course.    Baseline  53/100    Time 12    Period Weeks    Status New    Target Date 12/21/20      OT LONG TERM GOAL #3   Title Pt will be able to independently apply multi-layer, short stretch compression wraps to one leg at a time using correct gradient techniques in an effort  to return the affected  limb(s)  to premorbid size and shape, to limit pain and infection risk, and to improve functional mobility for ADLs.    Baseline dependent    Time 4    Period Days    Status New    Target Date --   4th OT Rx session     OT LONG TERM GOAL #4   Title Pt will achieve at least 10% limb volume reduction in LLE during Intensive Phase CDT to prevent re-accumulation of lymphatic congestion and progression of fibrosis, to limit infection risk, to improve functional ambulation and transfers, and to improve functional performance of basic and instrumental ADLs, and to limit LE progression.    Baseline Max A    Time 12    Period Weeks    Status New    Target Date 12/21/20      OT LONG TERM GOAL #5   Title Pt will achieve and sustain a least 85% compliance with all LE self-care home program components throughout Intensive Phase CDT, including elevation when seated, recommended skin care regime, lymphatic pumping ther ex, 23/7 compression wraps and simple self-MLD. Max caregiver assistance is necessary to ensure optimal limb volume reduction, to limit infection risk and to limit LE progression.    Baseline Max A    Time 12    Period Weeks    Status New    Target Date 12/21/20      Long Term Additional Goals   Additional Long Term Goals Yes      OT LONG TERM GOAL #6   Title By DC from OT Pt will be able to don and doff appropriate daytime compression garments using assistive devices to limit lymphatic re-accumulation and LE progression with before transitioning to self-management phase of CDT.    Baseline Max A    Time 12    Period Weeks    Status New    Target Date 12/21/20                   Plan -  11/10/20 1342     Clinical Impression Statement Completed anatomical measurements for LLE custo, flat knit, Jobst ELVAREX compression stocking ( Elvarex ccl 2 (23 mmHg - 35 mmHg) and Jost RELAX HOS device. Reapplied gradient compression wrapss. Cont  as per OC.    OT Occupational Profile and History Comprehensive Assessment- Review of records and extensive additional review of physical, cognitive, psychosocial history related to current functional performance    Occupational performance deficits (Please refer to evaluation for details): ADL's;IADL's;Work;Leisure;Social Participation    Body Structure / Function / Physical Skills ADL;Edema;Skin integrity;Balance;Mobility;Pain;Decreased knowledge of precautions;ROM;Decreased knowledge of use of DME;Scar mobility;IADL    Rehab Potential Good    Clinical Decision Making Several treatment options, min-mod task modification necessary    Comorbidities Affecting Occupational Performance: Presence of comorbidities impacting occupational performance    Comorbidities impacting occupational performance description: see SUBJECTIVE    Modification or Assistance to Complete Evaluation  Min-Moderate modification of tasks or assist with assess necessary to complete eval    OT Frequency 2x / week    OT Duration 12 weeks    OT Treatment/Interventions Self-care/ADL training;Therapeutic exercise;Manual Therapy;Manual lymph drainage;Therapeutic activities;Coping strategies training;DME and/or AE instruction;Compression bandaging;Patient/family education    Plan Complete Decongestive Therapy (CDT) to LLE: MLD, skin care, ther ex and compression - initially utilize short stretch ompression wraps to reduce volume, then fit with appropriate compression garment. Consider full length garment as swelling in L thigh is obvious.    OT Home Exercise Plan lymphatic pumping ther ex- 2 sets of 10 reps each exercise in order-bilaterally    Recommended Other Services Consider fitting  with Flexitouch advanced sequential pneumatic compression device   to assist with long term LE self management at home.    Consulted and Agree with Plan of Care Patient             Patient will benefit from skilled therapeutic intervention in order to improve the following deficits and impairments:   Body Structure / Function / Physical Skills: ADL, Edema, Skin integrity, Balance, Mobility, Pain, Decreased knowledge of precautions, ROM, Decreased knowledge of use of DME, Scar mobility, IADL       Visit Diagnosis: Lymphedema, not elsewhere classified    Problem List Patient Active Problem List   Diagnosis Date Noted   Pre-operative exam    Duodenitis    Acute gastritis without hemorrhage    Encounter for screening colonoscopy    Benign neoplasm of ascending colon    Painful orthopaedic hardware ([employer]) 05/20/2014   OA (osteoarthritis) of hip 03/06/2013    Andrey Spearman, MS, OTR/L, CLT-LANA 11/10/20 1:47 PM   [place] [address] [place], [place], [address] Phone: [phone]   Fax:  [phone]  Name: Paul Byrd MRN: [id number] Date of Birth: 12-Jun-1950

## 2020-11-17 ENCOUNTER — Ambulatory Visit: Payer: BC Managed Care – PPO | Admitting: Occupational Therapy

## 2020-11-17 ENCOUNTER — Other Ambulatory Visit: Payer: Self-pay

## 2020-11-17 DIAGNOSIS — I890 Lymphedema, not elsewhere classified: Secondary | ICD-10-CM | POA: Diagnosis not present

## 2020-11-17 NOTE — Addendum Note (Signed)
Addended by: Ansel Bong on: 11/17/2020 12:55 PM   Modules accepted: Orders

## 2020-11-17 NOTE — Patient Instructions (Signed)
Lymphedema Self- Care Instructions  1. EXERCISE: Perform lymphatic pumping there ex 2 x a day. While wearing your compression wraps or garments. Perform 10 reps of each exercise bilaterally and be sure to perform them in order. Don;t skip around!  OMIT PARTIAL SIT UPs.  2. MLD: Perform simple self-Manual Lymphatic Drainage (MLD) at least once a day as directed.  3. If you have a Flexitouch advanced "pump" use it 1 time each day on a single limb only. The Flexitouch moves lymphatic fluid out of your affected body part and back to your heart, so DO NOT use the Flexi on 2 legs at a time, and DO NOT ues it on 2 legs on the same day. If you experience any atypical shortness of breath, sudden onset of pain, or feelings of heart arhythmia, or racing, discontinue use of the Flexitouch and report these symptoms to your doctor right away.  4. WRAPS: Compression wraps are to be worn 23 hrs/ 7 days/wk during Intensive Phase of Complete Decongestive Therapy (CDT).Building tolerance may take time and practice, so don't get discouraged. If bandages begin to feel tight during periods of inactivity and/or during the night, try performing your exercises to loosen them.   5. GARMENTS: During Management Phase CDT your compression garments are to be worn during waking hours when active. Do NOT sleep in your garments!!   6. PUT YOUR FEET UP! Elevate your feet and legs and feet to the level of your heart whenever you are sitting down.   7. SKIN: Carefully monitor skin condition and perform impeccable hygiene daily. Bathe skin with mild soap and water and apply low pH lotion (aka Eucerin ) to improve hydration and limit infection risk.

## 2020-11-17 NOTE — Therapy (Signed)
[place] MAIN [facility] SERVICES [address] [place], [place], [address] Phone: [phone]   Fax:  [phone]  Occupational Therapy Treatment  Patient Details  Name: Paul Byrd MRN: [id number] Date of Birth: 06-11-1950 Referring Provider (OT): Ronita Hipps, MD   Encounter Date: 11/17/2020   OT End of Session - 11/17/20 1419     Visit Number 7    Number of Visits 36    Date for OT Re-Evaluation 12/21/20    OT Start Time 0100    OT Stop Time 0207    OT Time Calculation (min) 67 min    Activity Tolerance Patient tolerated treatment well;No increased pain             Past Medical History:  Diagnosis Date   Arthritis    Cancer ([place]) 2001   kidney-left(surgical removal only)   Chronic kidney disease    nephrectomy left kidney-cancer   Headache(784.0)    rare now    Past Surgical History:  Procedure Laterality Date   COLONOSCOPY WITH PROPOFOL N/A 12/05/2017   Procedure: COLONOSCOPY WITH PROPOFOL;  Surgeon: [name], MD;  Location: [facility] ENDOSCOPY;  Service: Endoscopy;  Laterality: N/A;   ESOPHAGOGASTRODUODENOSCOPY (EGD) WITH PROPOFOL N/A 12/05/2017   Procedure: ESOPHAGOGASTRODUODENOSCOPY (EGD) WITH PROPOFOL;  Surgeon: [name], MD;  Location: [facility] ENDOSCOPY;  Service: Endoscopy;  Laterality: N/A;   HARDWARE REMOVAL Right 05/20/2014   Procedure: RIGHT HIP HARDWARE REMOVAL;  Surgeon: [name], MD;  Location: [facility] ORS;  Service: Orthopedics;  Laterality: Right;   JOINT REPLACEMENT     right hip x 2   NEPHRECTOMY  2001   left kidney for cancer   scar tissue removal     right chin   SHOULDER SURGERY     right x 2, left x 1   TONSILLECTOMY     TOTAL HIP ARTHROPLASTY Left 03/06/2013   Procedure: LEFT TOTAL HIP ARTHROPLASTY ANTERIOR APPROACH;  Surgeon: [name], MD;  Location: [facility] ORS;  Service: Orthopedics;  Laterality: Left;    There were no vitals filed for this visit.   Subjective Assessment - 11/17/20  1427     Subjective  Paul Byrd presents for OT visit 7/36 to address LLE lymphedema  2/2 venous trauama and insufficiency. Pt reports 1/10 ankle pain. Pt tells me that he received the Flexitouch device last week, and trainer from [employer] with set up and edu on 8/28. Pt reports he declined recommended custom compression stocking when discussing with DME vendor the cost since they are not in network with [employer].    Pertinent History OA, chronic kidney disease, painful orthopedic hardware (l ANKLE)    Limitations chronic L leg swelling and associated pain limits standing  and walking tolerance (20-30 minutes max), impairs balance, L>R,  causes difficulty fitting preferred street shoesand LB clothing    Repetition Increases Symptoms    Special Tests + Stemmer L base of toes    Patient Stated Goals "I know I'll have it forever, but I want to keep it to a minimum    Pain Onset --   07/16/2019                         OT Treatments/Exercises (OP) - 11/17/20 1429       ADLs   ADL Education Given Yes      Manual Therapy   Manual Therapy Edema management;Compression Bandaging    Edema Management mld  to LLE as established    Compression Bandaging LLE knee length wraps using gradient techniques with 8.10 and 12 cm wide short stretch bandages over single layer cotton stockinet and 0.4 cm thick Rosidal foam.                    OT Education - 11/17/20 1430     Education Details Continued skilled Pt/caregiver education  And LE ADL training throughout visit for lymphedema self care/ home program, including compression wrapping, compression garment and device wear/care, lymphatic pumping ther ex, simple self-MLD, and skin care. Discussed initial volumetric measurements. Discussed all long term goals.    Person(s) Educated Patient    Methods Explanation;Demonstration;Handout    Comprehension Verbalized understanding;Returned demonstration;Need further instruction                  OT Long Term Goals - 09/23/20 1302       OT LONG TERM GOAL #1   Title Pt modified independent with lymphedema precautions and prevention strategies using printed handout as reference to ID and give examples of at least 4 Precautions/ prevention principals - to limit LE progression and infection risk.    Baseline Max A    Time 4    Period Days    Status New    Target Date --   4th OT Rx visit     OT LONG TERM GOAL #2   Title Given this patient's risk-adjustment variables, Paul Byrd intake score on the Functional Outcom measure (FOTO) measures 53/100 . The higher     the score out of 100 indicates higher level of functional performance. Patient will experience at least an increase in function of 6 points by end of OT course.    Baseline 53/100    Time 12    Period Weeks    Status New    Target Date 12/21/20      OT LONG TERM GOAL #3   Title Pt will be able to independently apply multi-layer, short stretch compression wraps to one leg at a time using correct gradient techniques in an effort  to return the affected  limb(s)  to premorbid size and shape, to limit pain and infection risk, and to improve functional mobility for ADLs.    Baseline dependent    Time 4    Period Days    Status New    Target Date --   4th OT Rx session     OT LONG TERM GOAL #4   Title Pt will achieve at least 10% limb volume reduction in LLE during Intensive Phase CDT to prevent re-accumulation of lymphatic congestion and progression of fibrosis, to limit infection risk, to improve functional ambulation and transfers, and to improve functional performance of basic and instrumental ADLs, and to limit LE progression.    Baseline Max A    Time 12    Period Weeks    Status New    Target Date 12/21/20      OT LONG TERM GOAL #5   Title Pt will achieve and sustain a least 85% compliance with all LE self-care home program components throughout Intensive Phase CDT, including elevation when  seated, recommended skin care regime, lymphatic pumping ther ex, 23/7 compression wraps and simple self-MLD. Max caregiver assistance is necessary to ensure optimal limb volume reduction, to limit infection risk and to limit LE progression.    Baseline Max A    Time 12    Period Weeks  Status New    Target Date 12/21/20      Long Term Additional Goals   Additional Long Term Goals Yes      OT LONG TERM GOAL #6   Title By DC from OT Pt will be able to don and doff appropriate daytime compression garments using assistive devices to limit lymphatic re-accumulation and LE progression with before transitioning to self-management phase of CDT.    Baseline Max A    Time 12    Period Weeks    Status New    Target Date 12/21/20                   Plan - 11/17/20 1419     Clinical Impression Statement DME vendor emailed and advised me that Pt wants to hold off on recommended compression garment and see how Flexitouch works on comtrolling his swelling in the meantime. OT advised Pt that the Flexitouch device is just one more tool in the LE m self management toolbox to assist him with long term self care at home, and not a magic bullet to curing chronic, proprogressive swelling with fibrosis. OT also advised DME vendor to refer Pt back to therapist when they decline recommended compression garments to communicate the status. Pt continunes to use knee length compression wraps on his leg with excellent lymphedema control, but long -term control with wrapping is rarely sustainable. OT advised Pt to reconsider his chice about not obtaining recommended garments at this time. Pt reports trainer from [employer] is not available to assist with Flexitouch set up until 8/28, so using his device at present is not an option. Pt understands that without  appropriate compression controlling swelling is not possible. If he feels the custom garment is too expensive, we will certainly assist him with trialing  and OOT circular knit garment initially. Pt tolerated MLD well without increased pain. Re-applied compression wraps at end of session. Cont as per POC.    OT Occupational Profile and History Comprehensive Assessment- Review of records and extensive additional review of physical, cognitive, psychosocial history related to current functional performance    Occupational performance deficits (Please refer to evaluation for details): ADL's;IADL's;Work;Leisure;Social Participation    Body Structure / Function / Physical Skills ADL;Edema;Skin integrity;Balance;Mobility;Pain;Decreased knowledge of precautions;ROM;Decreased knowledge of use of DME;Scar mobility;IADL    Rehab Potential Good    Clinical Decision Making Several treatment options, min-mod task modification necessary    Comorbidities Affecting Occupational Performance: Presence of comorbidities impacting occupational performance    Comorbidities impacting occupational performance description: see SUBJECTIVE    Modification or Assistance to Complete Evaluation  Min-Moderate modification of tasks or assist with assess necessary to complete eval    OT Frequency 2x / week    OT Duration 12 weeks    OT Treatment/Interventions Self-care/ADL training;Therapeutic exercise;Manual Therapy;Manual lymph drainage;Therapeutic activities;Coping strategies training;DME and/or AE instruction;Compression bandaging;Patient/family education    Plan Complete Decongestive Therapy (CDT) to LLE: MLD, skin care, ther ex and compression - initially utilize short stretch ompression wraps to reduce volume, then fit with appropriate compression garment. Consider full length garment as swelling in L thigh is obvious.    OT Home Exercise Plan lymphatic pumping ther ex- 2 sets of 10 reps each exercise in order-bilaterally    Recommended Other Services Consider fitting with Flexitouch advanced sequential pneumatic compression device   to assist with long term LE self management at  home.    Consulted and Agree with Plan of Care Patient  Patient will benefit from skilled therapeutic intervention in order to improve the following deficits and impairments:   Body Structure / Function / Physical Skills: ADL, Edema, Skin integrity, Balance, Mobility, Pain, Decreased knowledge of precautions, ROM, Decreased knowledge of use of DME, Scar mobility, IADL       Visit Diagnosis: Lymphedema, not elsewhere classified    Problem List Patient Active Problem List   Diagnosis Date Noted   Pre-operative exam    Duodenitis    Acute gastritis without hemorrhage    Encounter for screening colonoscopy    Benign neoplasm of ascending colon    Painful orthopaedic hardware ([place]) 05/20/2014   OA (osteoarthritis) of hip 03/06/2013    Andrey Spearman, MS, OTR/L, [facility] 11/17/20 2:31 PM  [place] [address] [place], [place], [address] Phone: [phone]   Fax:  [phone]  Name: Paul Byrd MRN: [id number] Date of Birth: 08/16/1950

## 2020-11-24 ENCOUNTER — Ambulatory Visit: Payer: BC Managed Care – PPO | Admitting: Occupational Therapy

## 2020-11-24 ENCOUNTER — Other Ambulatory Visit: Payer: Self-pay

## 2020-11-24 DIAGNOSIS — I890 Lymphedema, not elsewhere classified: Secondary | ICD-10-CM | POA: Diagnosis not present

## 2020-11-24 NOTE — Therapy (Signed)
[place] MAIN [facility] SERVICES [address] [place], [place], [address] Phone: [phone]   Fax:  [phone]  Occupational Therapy Treatment  Patient Details  Name: Paul Byrd MRN: [id number] Date of Birth: 1950-07-28 Referring Provider (OT): Ronita Hipps, MD   Encounter Date: 11/24/2020   OT End of Session - 11/24/20 1702     Visit Number 8    Number of Visits 36    Date for OT Re-Evaluation 12/21/20    OT Start Time 0103    OT Stop Time 0203    OT Time Calculation (min) 60 min    Activity Tolerance Patient tolerated treatment well;No increased pain             Past Medical History:  Diagnosis Date   Arthritis    Cancer ([place]) 2001   kidney-left(surgical removal only)   Chronic kidney disease    nephrectomy left kidney-cancer   Headache(784.0)    rare now    Past Surgical History:  Procedure Laterality Date   COLONOSCOPY WITH PROPOFOL N/A 12/05/2017   Procedure: COLONOSCOPY WITH PROPOFOL;  Surgeon: [name], MD;  Location: [facility] ENDOSCOPY;  Service: Endoscopy;  Laterality: N/A;   ESOPHAGOGASTRODUODENOSCOPY (EGD) WITH PROPOFOL N/A 12/05/2017   Procedure: ESOPHAGOGASTRODUODENOSCOPY (EGD) WITH PROPOFOL;  Surgeon: [name], MD;  Location: [facility] ENDOSCOPY;  Service: Endoscopy;  Laterality: N/A;   HARDWARE REMOVAL Right 05/20/2014   Procedure: RIGHT HIP HARDWARE REMOVAL;  Surgeon: [name], MD;  Location: [facility] ORS;  Service: Orthopedics;  Laterality: Right;   JOINT REPLACEMENT     right hip x 2   NEPHRECTOMY  2001   left kidney for cancer   scar tissue removal     right chin   SHOULDER SURGERY     right x 2, left x 1   TONSILLECTOMY     TOTAL HIP ARTHROPLASTY Left 03/06/2013   Procedure: LEFT TOTAL HIP ARTHROPLASTY ANTERIOR APPROACH;  Surgeon: [name], MD;  Location: [facility] ORS;  Service: Orthopedics;  Laterality: Left;    There were no vitals filed for this visit.   Subjective Assessment - 11/24/20  1707     Subjective  Paul Byrd presents for OT visit 836 to address LLE lymphedema  2/2 venous trauama and insufficiency. Pt reports 1/10 ankle pain.    Pertinent History OA, chronic kidney disease, painful orthopedic hardware (l ANKLE)    Limitations chronic L leg swelling and associated pain limits standing  and walking tolerance (20-30 minutes max), impairs balance, L>R,  causes difficulty fitting preferred street shoesand LB clothing    Repetition Increases Symptoms    Special Tests + Stemmer L base of toes    Patient Stated Goals "I know I'll have it forever, but I want to keep it to a minimum    Pain Onset --   07/16/2019                         OT Treatments/Exercises (OP) - 11/24/20 1708       ADLs   ADL Education Given Yes      Manual Therapy   Manual Therapy Edema management;Compression Bandaging    Edema Management mld to LLE as established    Compression Bandaging Added comprex kidney to nferior medial maleolus to address chronmic fatty sweling pocketed there. layer cotton stockinet and 0.4 cm thick Rosidal foam.  OT Education - 11/24/20 1709     Education Details Continued skilled Pt/caregiver education  And LE ADL training throughout visit for lymphedema self care/ home program, including compression wrapping, compression garment and device wear/care, lymphatic pumping ther ex, simple self-MLD, and skin care. Discussed initial volumetric measurements. Discussed all long term goals.    Person(s) Educated Patient    Methods Explanation;Demonstration;Handout    Comprehension Verbalized understanding;Returned demonstration;Need further instruction                 OT Long Term Goals - 09/23/20 1302       OT LONG TERM GOAL #1   Title Pt modified independent with lymphedema precautions and prevention strategies using printed handout as reference to ID and give examples of at least 4 Precautions/ prevention  principals - to limit LE progression and infection risk.    Baseline Max A    Time 4    Period Days    Status New    Target Date --   4th OT Rx visit     OT LONG TERM GOAL #2   Title Given this patient's risk-adjustment variables, Paul Byrd intake score on the Functional Outcom measure (FOTO) measures 53/100 . The higher     the score out of 100 indicates higher level of functional performance. Patient will experience at least an increase in function of 6 points by end of OT course.    Baseline 53/100    Time 12    Period Weeks    Status New    Target Date 12/21/20      OT LONG TERM GOAL #3   Title Pt will be able to independently apply multi-layer, short stretch compression wraps to one leg at a time using correct gradient techniques in an effort  to return the affected  limb(s)  to premorbid size and shape, to limit pain and infection risk, and to improve functional mobility for ADLs.    Baseline dependent    Time 4    Period Days    Status New    Target Date --   4th OT Rx session     OT LONG TERM GOAL #4   Title Pt will achieve at least 10% limb volume reduction in LLE during Intensive Phase CDT to prevent re-accumulation of lymphatic congestion and progression of fibrosis, to limit infection risk, to improve functional ambulation and transfers, and to improve functional performance of basic and instrumental ADLs, and to limit LE progression.    Baseline Max A    Time 12    Period Weeks    Status New    Target Date 12/21/20      OT LONG TERM GOAL #5   Title Pt will achieve and sustain a least 85% compliance with all LE self-care home program components throughout Intensive Phase CDT, including elevation when seated, recommended skin care regime, lymphatic pumping ther ex, 23/7 compression wraps and simple self-MLD. Max caregiver assistance is necessary to ensure optimal limb volume reduction, to limit infection risk and to limit LE progression.    Baseline Max A    Time 12     Period Weeks    Status New    Target Date 12/21/20      Long Term Additional Goals   Additional Long Term Goals Yes      OT LONG TERM GOAL #6   Title By DC from OT Pt will be able to don and doff appropriate daytime compression garments using assistive  devices to limit lymphatic re-accumulation and LE progression with before transitioning to self-management phase of CDT.    Baseline Max A    Time 12    Period Weeks    Status New    Target Date 12/21/20                   Plan - 11/24/20 1702     Clinical Impression Statement Discussed alternate, less costly  compression garment plan  and recommended.  Rather than risk abandonning lymphedema self-care when bandaging becomes too great a heardship, Pt may benefit from off-the-shelf, circular knit, ccl 2 ( 30-40 mmHg) knee high stockings. We completed anatomical measurements and determined he should fit into recommended JUZO DYNAMIC, ccl 2, size 4, regular length knee highs. Provided multiple on line sources for purchase. Continued MLD , skin care and compression wraps as established without increased pain. Modified compression wraps by adding a comprex kidney pad to inferior medial maleolus to address fatty swelling noted there on a constant basis. Cont as per POC. Pt agrees with plan to purchase OTS stockings ASAP.    OT Occupational Profile and History Comprehensive Assessment- Review of records and extensive additional review of physical, cognitive, psychosocial history related to current functional performance    Occupational performance deficits (Please refer to evaluation for details): ADL's;IADL's;Work;Leisure;Social Participation    Body Structure / Function / Physical Skills ADL;Edema;Skin integrity;Balance;Mobility;Pain;Decreased knowledge of precautions;ROM;Decreased knowledge of use of DME;Scar mobility;IADL    Rehab Potential Good    Clinical Decision Making Several treatment options, min-mod task modification necessary     Comorbidities Affecting Occupational Performance: Presence of comorbidities impacting occupational performance    Comorbidities impacting occupational performance description: see SUBJECTIVE    Modification or Assistance to Complete Evaluation  Min-Moderate modification of tasks or assist with assess necessary to complete eval    OT Frequency 2x / week    OT Duration 12 weeks    OT Treatment/Interventions Self-care/ADL training;Therapeutic exercise;Manual Therapy;Manual lymph drainage;Therapeutic activities;Coping strategies training;DME and/or AE instruction;Compression bandaging;Patient/family education    Plan Complete Decongestive Therapy (CDT) to LLE: MLD, skin care, ther ex and compression - initially utilize short stretch ompression wraps to reduce volume, then fit with appropriate compression garment. Consider full length garment as swelling in L thigh is obvious.    OT Home Exercise Plan lymphatic pumping ther ex- 2 sets of 10 reps each exercise in order-bilaterally    Recommended Other Services Consider fitting with Flexitouch advanced sequential pneumatic compression device   to assist with long term LE self management at home.    Consulted and Agree with Plan of Care Patient             Patient will benefit from skilled therapeutic intervention in order to improve the following deficits and impairments:   Body Structure / Function / Physical Skills: ADL, Edema, Skin integrity, Balance, Mobility, Pain, Decreased knowledge of precautions, ROM, Decreased knowledge of use of DME, Scar mobility, IADL       Visit Diagnosis: Lymphedema, not elsewhere classified    Problem List Patient Active Problem List   Diagnosis Date Noted   Pre-operative exam    Duodenitis    Acute gastritis without hemorrhage    Encounter for screening colonoscopy    Benign neoplasm of ascending colon    Painful orthopaedic hardware ([place]) 05/20/2014   OA (osteoarthritis) of hip 03/06/2013     Paul Spearman, MS, OTR/L, CLT-LANA 11/24/20 5:10 PM   [place]  [place], [place], [address] Phone: [phone]   Fax:  [phone]  Name: Paul Byrd MRN: [id number] Date of Birth: 1950-05-25

## 2020-11-24 NOTE — Patient Instructions (Signed)
Lymphedema Self- Care Instructions   1. EXERCISE: Perform lymphatic pumping there ex 2 x a day. While wearing your compression wraps or garments. Perform 10 reps of each exercise bilaterally and be sure to perform them in order. Don;t skip around!  OMIT PARTIAL SIT UPs.  2. MLD: Perform simple self-Manual Lymphatic Drainage (MLD) at least once a day as directed.  3. If you have a Flexitouch advanced "pump" use it 1 time each day on a single limb only. The Flexitouch moves lymphatic fluid out of your affected body part and back to your heart, so DO NOT use the Flexi on 2 legs at a time, and DO NOT ues it on 2 legs on the same day. If you experience any atypical shortness of breath, sudden onset of pain, or feelings of heart arhythmia, or racing, discontinue use of the Flexitouch and report these symptoms to your doctor right away.  4. 4. WRAPS: Compression wraps are to be worn 23 hrs/ 7 days/wk during Intensive Phase of Complete Decongestive Therapy (CDT).Building tolerance may take time and practice, so don't get discouraged. If bandages begin to feel tight during periods of inactivity and/or during the night, try performing your exercises to loosen them. Do not leave short stretch wraps in place for > 23 hours. It is very important that you remove all wraps daily to inspect skin, bathe and perform skin care before reapplying multi-layer, short stretch gradient compression wraps.  5. Daytime GARMENTS/ HOS DEVICES: During Management Phase CDT your compression garments are to be worn during waking hours when active. Do NOT sleep in your garments!!  Don daytime garments first thing in the morning. Do not wear your HOS devices all day instead of garments. These are not made to be tight and will not contain your swelling like your stockings will.  6. PUT YOUR FEET UP! Elevate your feet and legs and feet to the level of your heart whenever you are sitting down.   7. SKIN: Carefully monitor skin condition  and perform impeccable hygiene daily. Bathe skin with mild soap and water and apply low pH lotion (aka Eucerin ) to improve hydration and limit infection risk.

## 2020-12-01 ENCOUNTER — Ambulatory Visit: Payer: BC Managed Care – PPO | Admitting: Occupational Therapy

## 2020-12-01 ENCOUNTER — Other Ambulatory Visit: Payer: Self-pay

## 2020-12-01 DIAGNOSIS — I890 Lymphedema, not elsewhere classified: Secondary | ICD-10-CM | POA: Diagnosis not present

## 2020-12-02 NOTE — Therapy (Signed)
[place] MAIN [facility] SERVICES [address] [place], [place], [address] Phone: [phone]   Fax:  [phone]  Occupational Therapy Treatment  Patient Details  Name: Paul Byrd MRN: [id number] Date of Birth: 09/15/1950 Referring Provider (OT): Ronita Hipps, MD   Encounter Date: 12/01/2020   OT End of Session - 12/01/20 1225     Visit Number 9    Number of Visits 36    Date for OT Re-Evaluation 12/21/20    OT Start Time 0205    OT Stop Time 0305    OT Time Calculation (min) 60 min    Activity Tolerance Patient tolerated treatment well;No increased pain             Past Medical History:  Diagnosis Date   Arthritis    Cancer ([place]) 2001   kidney-left(surgical removal only)   Chronic kidney disease    nephrectomy left kidney-cancer   Headache(784.0)    rare now    Past Surgical History:  Procedure Laterality Date   COLONOSCOPY WITH PROPOFOL N/A 12/05/2017   Procedure: COLONOSCOPY WITH PROPOFOL;  Surgeon: [name], MD;  Location: [facility] ENDOSCOPY;  Service: Endoscopy;  Laterality: N/A;   ESOPHAGOGASTRODUODENOSCOPY (EGD) WITH PROPOFOL N/A 12/05/2017   Procedure: ESOPHAGOGASTRODUODENOSCOPY (EGD) WITH PROPOFOL;  Surgeon: [name], MD;  Location: [facility] ENDOSCOPY;  Service: Endoscopy;  Laterality: N/A;   HARDWARE REMOVAL Right 05/20/2014   Procedure: RIGHT HIP HARDWARE REMOVAL;  Surgeon: [name], MD;  Location: [facility];  Service: Orthopedics;  Laterality: Right;   JOINT REPLACEMENT     right hip x 2   NEPHRECTOMY  2001   left kidney for cancer   scar tissue removal     right chin   SHOULDER SURGERY     right x 2, left x 1   TONSILLECTOMY     TOTAL HIP ARTHROPLASTY Left 03/06/2013   Procedure: LEFT TOTAL HIP ARTHROPLASTY ANTERIOR APPROACH;  Surgeon: [name], MD;  Location: [facility];  Service: Orthopedics;  Laterality: Left;    There were no vitals filed for this visit.   Subjective Assessment - 12/01/20  1232     Subjective  Paul Byrd presents for OT visit 9/36 to address LLE lymphedema  2/2 venous trauama and insufficiency. Pt reports 1/10 pain in affected ankle. Pt presents wearing recommended  knee length  Juzo, ccl 2   (30-40 mmHg) compression stocking.pulled up over knee and folded down at to edge, causing redness and  bunching at popliteal fossa.    Pertinent History OA, chronic kidney disease, painful orthopedic hardware (l ANKLE)    Limitations chronic L leg swelling and associated pain limits standing  and walking tolerance (20-30 minutes max), impairs balance, L>R,  causes difficulty fitting preferred street shoesand LB clothing    Repetition Increases Symptoms    Special Tests + Stemmer L base of toes    Patient Stated Goals "I know I'll have it forever, but I want to keep it to a minimum    Pain Onset --   07/16/2019                         OT Treatments/Exercises (OP) - 12/02/20 1236       ADLs   ADL Education Given Yes      Manual Therapy   Manual Therapy Edema management;Compression Bandaging;Manual Lymphatic Drainage (MLD)    Edema Management mld to LLE as established  Compression Bandaging Fitted Juzo DYNAMIC, ccl  2 (30-40 mmHg)  knee length, open toe compression stocking.                    OT Education - 12/02/20 1238     Education Details Continued skilled Pt/caregiver education  And LE ADL training throughout visit for lymphedema self care/ home program, including compression wrapping, compression garment and device wear/care, lymphatic pumping ther ex, simple self-MLD, and skin care. Discussed initial volumetric measurements. Discussed all long term goals.    Person(s) Educated Patient    Methods Explanation;Demonstration;Handout    Comprehension Verbalized understanding;Returned demonstration;Need further instruction                 OT Long Term Goals - 09/23/20 1302       OT LONG TERM GOAL #1   Title Pt modified  independent with lymphedema precautions and prevention strategies using printed handout as reference to ID and give examples of at least 4 Precautions/ prevention principals - to limit LE progression and infection risk.    Baseline Max A    Time 4    Period Days    Status New    Target Date --   4th OT Rx visit     OT LONG TERM GOAL #2   Title Given this patient's risk-adjustment variables, Paul Byrd intake score on the Functional Outcom measure (FOTO) measures 53/100 . The higher     the score out of 100 indicates higher level of functional performance. Patient will experience at least an increase in function of 6 points by end of OT course.    Baseline 53/100    Time 12    Period Weeks    Status New    Target Date 12/21/20      OT LONG TERM GOAL #3   Title Pt will be able to independently apply multi-layer, short stretch compression wraps to one leg at a time using correct gradient techniques in an effort  to return the affected  limb(s)  to premorbid size and shape, to limit pain and infection risk, and to improve functional mobility for ADLs.    Baseline dependent    Time 4    Period Days    Status New    Target Date --   4th OT Rx session     OT LONG TERM GOAL #4   Title Pt will achieve at least 10% limb volume reduction in LLE during Intensive Phase CDT to prevent re-accumulation of lymphatic congestion and progression of fibrosis, to limit infection risk, to improve functional ambulation and transfers, and to improve functional performance of basic and instrumental ADLs, and to limit LE progression.    Baseline Max A    Time 12    Period Weeks    Status New    Target Date 12/21/20      OT LONG TERM GOAL #5   Title Pt will achieve and sustain a least 85% compliance with all LE self-care home program components throughout Intensive Phase CDT, including elevation when seated, recommended skin care regime, lymphatic pumping ther ex, 23/7 compression wraps and simple self-MLD.  Max caregiver assistance is necessary to ensure optimal limb volume reduction, to limit infection risk and to limit LE progression.    Baseline Max A    Time 12    Period Weeks    Status New    Target Date 12/21/20      Long Term Additional Goals  Additional Long Term Goals Yes      OT LONG TERM GOAL #6   Title By DC from OT Pt will be able to don and doff appropriate daytime compression garments using assistive devices to limit lymphatic re-accumulation and LE progression with before transitioning to self-management phase of CDT.    Baseline Max A    Time 12    Period Weeks    Status New    Target Date 12/21/20                   Plan - 12/02/20 1226     Clinical Impression Statement Completed fitting for off the shelf, medical grade compression knee high. Intially Pt had knee high pulled up too far causing painful folding and bunching behid the knee. Pt educated for proper garment positioning and donning techniques to reduce physical effort. Once educated Pt able to don garment correctly and tourniquet effect ay popliteal fossa was eliminated. Slightly increased edema observed at distal leg and ankle before and after MLD. Cont as per POC.Provided MLD to LLE as established for remainder of session.    OT Occupational Profile and History Comprehensive Assessment- Review of records and extensive additional review of physical, cognitive, psychosocial history related to current functional performance    Occupational performance deficits (Please refer to evaluation for details): ADL's;IADL's;Work;Leisure;Social Participation    Body Structure / Function / Physical Skills ADL;Edema;Skin integrity;Balance;Mobility;Pain;Decreased knowledge of precautions;ROM;Decreased knowledge of use of DME;Scar mobility;IADL    Rehab Potential Good    Clinical Decision Making Several treatment options, min-mod task modification necessary    Comorbidities Affecting Occupational Performance: Presence of  comorbidities impacting occupational performance    Comorbidities impacting occupational performance description: see SUBJECTIVE    Modification or Assistance to Complete Evaluation  Min-Moderate modification of tasks or assist with assess necessary to complete eval    OT Frequency 2x / week    OT Duration 12 weeks    OT Treatment/Interventions Self-care/ADL training;Therapeutic exercise;Manual Therapy;Manual lymph drainage;Therapeutic activities;Coping strategies training;DME and/or AE instruction;Compression bandaging;Patient/family education    Plan Complete Decongestive Therapy (CDT) to LLE: MLD, skin care, ther ex and compression - initially utilize short stretch ompression wraps to reduce volume, then fit with appropriate compression garment. Consider full length garment as swelling in L thigh is obvious.    OT Home Exercise Plan lymphatic pumping ther ex- 2 sets of 10 reps each exercise in order-bilaterally    Recommended Other Services Consider fitting with Flexitouch advanced sequential pneumatic compression device   to assist with long term LE self management at home.    Consulted and Agree with Plan of Care Patient             Patient will benefit from skilled therapeutic intervention in order to improve the following deficits and impairments:   Body Structure / Function / Physical Skills: ADL, Edema, Skin integrity, Balance, Mobility, Pain, Decreased knowledge of precautions, ROM, Decreased knowledge of use of DME, Scar mobility, IADL       Visit Diagnosis: Lymphedema, not elsewhere classified    Problem List Patient Active Problem List   Diagnosis Date Noted   Pre-operative exam    Duodenitis    Acute gastritis without hemorrhage    Encounter for screening colonoscopy    Benign neoplasm of ascending colon    Painful orthopaedic hardware ([place]) 05/20/2014   OA (osteoarthritis) of hip 03/06/2013    Andrey Spearman, MS, OTR/L, CLT-LANA 12/02/20 12:40 PM    [facility] MAIN [facility] SERVICES [place], [place], [address] Phone: [phone]   Fax:  [phone]  Name: Paul Byrd MRN: [id number] Date of Birth: 1950/12/24

## 2020-12-08 ENCOUNTER — Other Ambulatory Visit: Payer: Self-pay

## 2020-12-08 ENCOUNTER — Ambulatory Visit: Payer: BC Managed Care – PPO | Admitting: Occupational Therapy

## 2020-12-08 DIAGNOSIS — I890 Lymphedema, not elsewhere classified: Secondary | ICD-10-CM

## 2020-12-09 NOTE — Patient Instructions (Signed)
  Lymphedema Self- Care Instructions   1. EXERCISE: Perform lymphatic pumping there ex at least 2 x a day while wearing your compression wraps or garments. Perform 10 reps of each exercise bilaterally and be sure to perform them in order. Don't skip around!  OMIT PARTIAL SIT UPs.  2. MLD: Perform simple self-manual lymphatic drainage (MLD) at least once a day as directed. Take your time! Breathe! ;-)  3. If you have a Flexitouch advanced "pump" use it 1 time each day on a single limb only. The Flexitouch moves lymphatic fluid out of your affected body part and back to your heart, so DO NOT use the Flexi on 2 legs at a time, and DO NOT ues it on 2 legs on the same day. If you experience any atypical shortness of breath, sudden onset of pain, or feelings of heart arhythmia, or racing, discontinue use of the Flexitouch and report these symptoms to your doctor right away. Also, discontinue Flexi if you have an infection or a fever. It's OK to resume using the device 72 hours AFTER your first dose of oral antibiotic.   4. 4. WRAPS: Compression wraps are to be worn 23 hrs/ 7 days/wk during Intensive Phase of Complete Decongestive Therapy (CDT).Building tolerance may take time and practice, so don't get discouraged. If bandages begin to feel tight during periods of inactivity and/or during the night, try performing your exercises to loosen them. Do not leave short stretch wraps in place for > 23 hours. It is very important that you remove all wraps daily to inspect skin, bathe and perform skin care before reapplying your wraps.  5. Daytime GARMENTS/ HOS DEVICES: During the Self-Management Phase CDT your compression garments are to be worn during waking hours when active. Do NOT sleep in your garments!!  Don daytime garments first thing in the morning. Do not wear your HOS devices all day instead of garments. These will not contain your swelling.  6. PUT YOUR FEET UP! Elevate your feet and legs and feet to the  level of your heart whenever you are sitting down.   7. SKIN: Carefully monitor skin condition and perform impeccable hygiene daily. Bathe skin with mild soap and water and apply low pH lotion (aka Eucerin ) to improve hydration and limit infection risk.  

## 2020-12-09 NOTE — Therapy (Signed)
[place] MAIN [facility] SERVICES [address] [place], [place], [address] Phone: [phone]   Fax:  [phone]  Reporting period: 09/22/20 - 12/08/20  Occupational Therapy Treatment Note and Progress ReporT:  Lymphedema Care  Patient Details  Name: Paul Byrd MRN: [id number] Date of Birth: 12-24-1950 Referring Provider (OT): Ronita Hipps, MD   Encounter Date: 12/08/2020   OT End of Session - 12/08/20 1352     Visit Number 10    Number of Visits 36    Date for OT Re-Evaluation 12/21/20    OT Start Time 0150    OT Stop Time 0301    OT Time Calculation (min) 71 min    Activity Tolerance Patient tolerated treatment well;No increased pain             Past Medical History:  Diagnosis Date   Arthritis    Cancer ([place]) 2001   kidney-left(surgical removal only)   Chronic kidney disease    nephrectomy left kidney-cancer   Headache(784.0)    rare now    Past Surgical History:  Procedure Laterality Date   COLONOSCOPY WITH PROPOFOL N/A 12/05/2017   Procedure: COLONOSCOPY WITH PROPOFOL;  Surgeon: [name], MD;  Location: [facility] ENDOSCOPY;  Service: Endoscopy;  Laterality: N/A;   ESOPHAGOGASTRODUODENOSCOPY (EGD) WITH PROPOFOL N/A 12/05/2017   Procedure: ESOPHAGOGASTRODUODENOSCOPY (EGD) WITH PROPOFOL;  Surgeon: [name], MD;  Location: [facility] ENDOSCOPY;  Service: Endoscopy;  Laterality: N/A;   HARDWARE REMOVAL Right 05/20/2014   Procedure: RIGHT HIP HARDWARE REMOVAL;  Surgeon: [name], MD;  Location: [facility] ORS;  Service: Orthopedics;  Laterality: Right;   JOINT REPLACEMENT     right hip x 2   NEPHRECTOMY  2001   left kidney for cancer   scar tissue removal     right chin   SHOULDER SURGERY     right x 2, left x 1   TONSILLECTOMY     TOTAL HIP ARTHROPLASTY Left 03/06/2013   Procedure: LEFT TOTAL HIP ARTHROPLASTY ANTERIOR APPROACH;  Surgeon: [name], MD;  Location: [facility] ORS;  Service: Orthopedics;  Laterality: Left;     There were no vitals filed for this visit.   Subjective Assessment - 12/08/20 0856     Subjective  Paul Byrd presents for OT visit 10/36 to address LLE lymphedema  2/2 venous trauama and insufficiency. Pt reports 1/10 pain in affected ankle.  Patient is accompanied by his wife today.  He tells me that his stockings (knee length  Juzo, ccl 2   (30-40 mmHg) ripped when he was donning them.  [place] in hopes of replacement.  He presents to clinic today without compression in place.  He tells me the trainer from [employer] completed his flex E touch training via FaceTime as she had COVID and was unable to attend in person.    Pertinent History OA, chronic kidney disease, painful orthopedic hardware (l ANKLE)    Limitations chronic L leg swelling and associated pain limits standing  and walking tolerance (20-30 minutes max), impairs balance, L>R,  causes difficulty fitting preferred street shoesand LB clothing    Repetition Increases Symptoms    Special Tests + Stemmer L base of toes    Patient Stated Goals "I know I'll have it forever, but I want to keep it to a minimum    Pain Onset --   07/16/2019  LYMPHEDEMA/ONCOLOGY QUESTIONNAIRE - 12/09/20 0906       Left Lower Extremity Lymphedema   Other L leg (A-D) volume= 4535.4. Leg volume is decreased by 3.4% since commencing CDT on 09/29/20. L thigh (E-G) measures 6486.6 ml. L thigh volume is essentially stable with .57% reduction. L full limb volume (A-G) measures 11021.9 ml. LLE volume reduction measures 4/8% overall.                     OT Treatments/Exercises (OP) - 12/09/20 0904       ADLs   ADL Education Given Yes      Manual Therapy   Manual Therapy Edema management;Compression Bandaging;Manual Lymphatic Drainage (MLD)    Edema Management LLE comparaive limb volumetrics    Manual Lymphatic Drainage (MLD) mld to LLE as established    Compression  Bandaging Pt wears ankle staboilizer brace after session. Pt as purchased comrpession stockings at the drugstore to use while awaiting replacement of recently purchased Juzo stockings. These are not available for assessment today. Pt states he will don when he returns home.                    OT Education - 12/08/20 0901     Education Details Reviewed progress towards all goals and POC going forward.    Person(s) Educated Patient;Spouse    Methods Explanation;Demonstration;Handout    Comprehension Verbalized understanding;Returned demonstration                 OT Long Term Goals - 12/08/20 1400       OT LONG TERM GOAL #1   Title Pt modified independent with lymphedema precautions and prevention strategies using printed handout as reference to ID and give examples of at least 4 Precautions/ prevention principals - to limit LE progression and infection risk.    Baseline Max A    Time 4    Period Days    Status Achieved   goal met     OT LONG TERM GOAL #2   Title Given this patient's risk-adjustment variables, Paul Byrd intake score on the Functional Outcom measure (FOTO) measures 53/100 . The higher     the score out of 100 indicates higher level of functional performance. Patient will experience at least an increase in function of 6 points by end of OT course.    Baseline 53/100    Time 12    Period Weeks    Status Achieved   score increased from 53 initially to 60/100 measured today. 7 pt increase  on functional outcome scale. Goal met     OT LONG TERM GOAL #3   Title Pt will be able to independently apply multi-layer, short stretch compression wraps to one leg at a time using correct gradient techniques in an effort  to return the affected  limb(s)  to premorbid size and shape, to limit pain and infection risk, and to improve functional mobility for ADLs.    Baseline dependent    Time 4    Period Days    Status Achieved      OT LONG TERM GOAL #4   Title Pt  will achieve at least 10% limb volume reduction in LLE during Intensive Phase CDT to prevent re-accumulation of lymphatic congestion and progression of fibrosis, to limit infection risk, to improve functional ambulation and transfers, and to improve functional performance of basic and instrumental ADLs, and to limit LE progression.    Baseline Max A  Time 12    Period Weeks    Status Partially Met   Partially met 10th visit, 12/08/20:  3.4% reduction in leg, .59% reduction in thigh and 4.8% reduction in limb from ankle to groin since initially measured on 09/29/20   Target Date 12/21/20      OT LONG TERM GOAL #5   Title Pt will achieve and sustain a least 85% compliance with all LE self-care home program components throughout Intensive Phase CDT, including elevation when seated, recommended skin care regime, lymphatic pumping ther ex, 23/7 compression wraps and simple self-MLD. Max caregiver assistance is necessary to ensure optimal limb volume reduction, to limit infection risk and to limit LE progression.    Baseline Max A    Time 12    Period Weeks    Status Partially Met   Pt reports 75% compliance w LE home program   Target Date 12/21/20      OT LONG TERM GOAL #6   Title By DC from OT Pt will be able to don and doff appropriate daytime compression garments using assistive devices to limit lymphatic re-accumulation and LE progression with before transitioning to self-management phase of CDT.    Baseline Max A    Time 12    Period Weeks    Status Achieved                   Plan - 12/08/20 1352     Clinical Impression Statement Pt has received Flexitouch and completed training. Pt states he is using the device as directed and feels it provides symptom relief. Reviewed all goals and progress to date.  Completed comparative volumetric measurements which reveals L leg (A-D) volume= 4535.4. Leg volume is decreased by 3.4% since commencing CDT on 09/29/20. L thigh (E-G) measures 6486.6  ml. L thigh volume is essentially stable with .57% reduction. L full limb volume (A-G) measures 11021.9 ml. LLE volume reduction measures 4/8% overall. Pt has partially achied 85% goal for home program compliance, including simple self-MLD, compression wraps and garments, skin care and ther ex, with reported 75% compliance. Please refer to Long Term Goals section of this note for additional details of progress to date. Provided MLD for remainer of visit. Pt tolerated manual therapy without pain. No compression after session as garments are unavailable and Pt does not bring alternative garments purchased at pharmacy for assessment today. Cont as per POC.    OT Occupational Profile and History Comprehensive Assessment- Review of records and extensive additional review of physical, cognitive, psychosocial history related to current functional performance    Occupational performance deficits (Please refer to evaluation for details): ADL's;IADL's;Work;Leisure;Social Participation    Body Structure / Function / Physical Skills ADL;Edema;Skin integrity;Balance;Mobility;Pain;Decreased knowledge of precautions;ROM;Decreased knowledge of use of DME;Scar mobility;IADL    Rehab Potential Good    Clinical Decision Making Several treatment options, min-mod task modification necessary    Comorbidities Affecting Occupational Performance: Presence of comorbidities impacting occupational performance    Comorbidities impacting occupational performance description: see SUBJECTIVE    Modification or Assistance to Complete Evaluation  Min-Moderate modification of tasks or assist with assess necessary to complete eval    OT Frequency 2x / week    OT Duration 12 weeks    OT Treatment/Interventions Self-care/ADL training;Therapeutic exercise;Manual Therapy;Manual lymph drainage;Therapeutic activities;Coping strategies training;DME and/or AE instruction;Compression bandaging;Patient/family education    Plan Complete Decongestive  Therapy (CDT) to LLE: MLD, skin care, ther ex and compression - initially utilize short stretch ompression wraps  to reduce volume, then fit with appropriate compression garment. Consider full length garment as swelling in L thigh is obvious.    OT Home Exercise Plan lymphatic pumping ther ex- 2 sets of 10 reps each exercise in order-bilaterally    Recommended Other Services Consider fitting with Flexitouch advanced sequential pneumatic compression device   to assist with long term LE self management at home.    Consulted and Agree with Plan of Care Patient             Patient will benefit from skilled therapeutic intervention in order to improve the following deficits and impairments:   Body Structure / Function / Physical Skills: ADL, Edema, Skin integrity, Balance, Mobility, Pain, Decreased knowledge of precautions, ROM, Decreased knowledge of use of DME, Scar mobility, IADL       Visit Diagnosis: Lymphedema, not elsewhere classified    Problem List Patient Active Problem List   Diagnosis Date Noted   Pre-operative exam    Duodenitis    Acute gastritis without hemorrhage    Encounter for screening colonoscopy    Benign neoplasm of ascending colon    Painful orthopaedic hardware ([place]) 05/20/2014   OA (osteoarthritis) of hip 03/06/2013    Paul Spearman, MS, OTR/L, [facility] 12/09/20 9:16 AM    [place] [address] [place], [place], [address] Phone: [phone]   Fax:  [phone]  Name: Paul Byrd MRN: [id number] Date of Birth: [birth year]

## 2020-12-15 ENCOUNTER — Ambulatory Visit: Payer: BC Managed Care – PPO | Admitting: Occupational Therapy

## 2020-12-24 ENCOUNTER — Other Ambulatory Visit: Payer: Self-pay

## 2020-12-24 ENCOUNTER — Ambulatory Visit: Payer: BC Managed Care – PPO | Attending: Vascular Surgery | Admitting: Occupational Therapy

## 2020-12-24 DIAGNOSIS — I890 Lymphedema, not elsewhere classified: Secondary | ICD-10-CM | POA: Insufficient documentation

## 2020-12-24 NOTE — Therapy (Signed)
[place] MAIN [facility] SERVICES [address] [place], [place], [address] Phone: [phone]   Fax:  [phone]  Occupational Therapy Treatment  Patient Details  Name: Paul Byrd MRN: [id number] Date of Birth: 03/16/1951 Referring Provider (OT): Ronita Hipps, MD   Encounter Date: 12/24/2020   OT End of Session - 12/24/20 1406     Visit Number 11    Number of Visits 36    Date for OT Re-Evaluation 12/21/20    OT Start Time 0100    OT Stop Time 0200    OT Time Calculation (min) 60 min    Activity Tolerance Patient tolerated treatment well;No increased pain    Behavior During Therapy [facility] for tasks assessed/performed             Past Medical History:  Diagnosis Date   Arthritis    Cancer ([place]) 2001   kidney-left(surgical removal only)   Chronic kidney disease    nephrectomy left kidney-cancer   Headache(784.0)    rare now    Past Surgical History:  Procedure Laterality Date   COLONOSCOPY WITH PROPOFOL N/A 12/05/2017   Procedure: COLONOSCOPY WITH PROPOFOL;  Surgeon: [name], MD;  Location: [facility] ENDOSCOPY;  Service: Endoscopy;  Laterality: N/A;   ESOPHAGOGASTRODUODENOSCOPY (EGD) WITH PROPOFOL N/A 12/05/2017   Procedure: ESOPHAGOGASTRODUODENOSCOPY (EGD) WITH PROPOFOL;  Surgeon: [name], MD;  Location: [facility] ENDOSCOPY;  Service: Endoscopy;  Laterality: N/A;   HARDWARE REMOVAL Right 05/20/2014   Procedure: RIGHT HIP HARDWARE REMOVAL;  Surgeon: [name], MD;  Location: [facility];  Service: Orthopedics;  Laterality: Right;   JOINT REPLACEMENT     right hip x 2   NEPHRECTOMY  2001   left kidney for cancer   scar tissue removal     right chin   SHOULDER SURGERY     right x 2, left x 1   TONSILLECTOMY     TOTAL HIP ARTHROPLASTY Left 03/06/2013   Procedure: LEFT TOTAL HIP ARTHROPLASTY ANTERIOR APPROACH;  Surgeon: [name], MD;  Location: [facility];  Service: Orthopedics;  Laterality: Left;    There were no  vitals filed for this visit.   Subjective Assessment - 12/24/20 1419     Subjective  Paul Byrd presents for OT visit 11/36 to address LLE lymphedema  2/2 venous trauama and insufficiency. Pt reports dramatic increase in L ankle last night during a work event requiring him so stand for ~ 2 hours, despite wearing compression stockings. Pt has some non-medical grade compression stockings and also recommended compression knee highs , Juzo ccl 2 (30-40 mmHg) , but he does not state which garments he was wearing when swelling occurred. OT recommended Pt add lite, single short stretch compression wrap over stocking, or wear a non-medical grade compression  stocking over top of his ccl 2 kbnee high when he is participating in exercise, when standing for an extended period of time, or whenparticipating in other physically demanding activities that may  increase blood circulation and resultant swelling.    Pertinent History OA, chronic kidney disease, painful orthopedic hardware (l ANKLE)    Limitations chronic L leg swelling and associated pain limits standing  and walking tolerance (20-30 minutes max), impairs balance, L>R,  causes difficulty fitting preferred street shoesand LB clothing    Repetition Increases Symptoms    Special Tests + Stemmer L base of toes    Patient Stated Goals "I know I'll have it forever, but I want to keep it  to a minimum    Pain Onset --   07/16/2019                         OT Treatments/Exercises (OP) - 12/24/20 1426       ADLs   ADL Education Given Yes      Manual Therapy   Manual Therapy Edema management;Manual Lymphatic Drainage (MLD)    Manual Lymphatic Drainage (MLD) mld to LLE as established                    OT Education - 12/24/20 1426     Education Details Reviewed progress towards all goals. Discussed possibly layering lite duty, non, medical grade compression knee high , or anklet, over ccl 2 recommended compression  stockings to control swelling during demanding activities. Discussed and reviewed all home program components and importance of diligent LE self- management to limit progression and negative impact on mobility    Person(s) Educated Patient    Methods Explanation;Demonstration    Comprehension Verbalized understanding;Returned demonstration                 OT Long Term Goals - 12/24/20 1407       OT LONG TERM GOAL #1   Title Pt modified independent with lymphedema precautions and prevention strategies using printed handout as reference to ID and give examples of at least 4 Precautions/ prevention principals - to limit LE progression and infection risk.    Baseline Max A    Time 4    Period Days    Status Achieved   goal met     OT LONG TERM GOAL #2   Title Given this patient's risk-adjustment variables, Mr. Gritton intake score on the Functional Outcom measure (FOTO) measures 53/100 . The higher     the score out of 100 indicates higher level of functional performance. Patient will experience at least an increase in function of 6 points by end of OT course.    Baseline 53/100    Time 12    Period Weeks    Status Achieved   score increased from 53 initially to 60/100 measured today. 7 pt increase  on functional outcome scale. Goal met     OT LONG TERM GOAL #3   Title Pt will be able to independently apply multi-layer, short stretch compression wraps to one leg at a time using correct gradient techniques in an effort  to return the affected  limb(s)  to premorbid size and shape, to limit pain and infection risk, and to improve functional mobility for ADLs.    Baseline dependent    Time 4    Period Days    Status Achieved      OT LONG TERM GOAL #4   Title Pt will achieve at least 10% limb volume reduction in LLE during Intensive Phase CDT to prevent re-accumulation of lymphatic congestion and progression of fibrosis, to limit infection risk, to improve functional ambulation and  transfers, and to improve functional performance of basic and instrumental ADLs, and to limit LE progression.    Baseline Max A    Time 12    Period Weeks    Status Partially Met   Partially met on 10th visit, 12/08/20 w/  3.4% reduction in L leg (ankle to tibial tuberosity),  .59% reduction in thigh ( knee to groin) and 4.8% total reduction in full limb (ankle to groin) since initially measured on 09/29/20  OT LONG TERM GOAL #5   Title Pt will achieve and sustain a least 85% compliance with all LE self-care home program components throughout Intensive Phase CDT, including elevation when seated, recommended skin care regime, lymphatic pumping ther ex, 23/7 compression wraps and simple self-MLD. Max caregiver assistance is necessary to ensure optimal limb volume reduction, to limit infection risk and to limit LE progression.    Baseline Max A    Time 12    Period Weeks    Status Achieved   Pt reports 75% compliance w LE home program     OT LONG TERM GOAL #6   Title By DC from OT Pt will be able to don and doff appropriate daytime compression garments using assistive devices to limit lymphatic re-accumulation and LE progression with before transitioning to self-management phase of CDT.    Baseline Max A    Time 12    Period Weeks    Status Achieved                   Plan - 12/24/20 1430     Clinical Impression Statement Pt has met all OT goals for lymphedema care except for l10% imb volume reduction goal. To date Pt has achievd a 3.4% volume reduction in L leg (ankle to tibial tuberosity),   a .59% reduction in the thigh ( knee to groin) and  a 4.8% total reduction in full limb (ankle to groin) since initially measured on 09/29/20. Swelling continues to fluctuate based on dependent positioning and physical demands. Pt declines additional Kinesiotape feeling it was not especially effective for him. Pt wears compression garments daily, although I'm not sure if he utilizes recommended  medical garade garment consistently. He has  an advanced Flexitouch device and uses it as recommended by report. He feels this device is very helpful to him and sys it provides an analgesic effect. Pt is indpendent with all home program components and feels he is prepared to transition to self -managment phase of CDT. Pt tolerated manual therapy without increased pain today. Tissue remains mobile and supple without induration. Pt agrees with plan to return in 3 months for follow along visit and will call PRN.    OT Occupational Profile and History Comprehensive Assessment- Review of records and extensive additional review of physical, cognitive, psychosocial history related to current functional performance    Occupational performance deficits (Please refer to evaluation for details): ADL's;IADL's;Work;Leisure;Social Participation    Body Structure / Function / Physical Skills ADL;Edema;Skin integrity;Balance;Mobility;Pain;Decreased knowledge of precautions;ROM;Decreased knowledge of use of DME;Scar mobility;IADL    Rehab Potential Good    Clinical Decision Making Several treatment options, min-mod task modification necessary    Comorbidities Affecting Occupational Performance: Presence of comorbidities impacting occupational performance    Comorbidities impacting occupational performance description: see SUBJECTIVE    Modification or Assistance to Complete Evaluation  Min-Moderate modification of tasks or assist with assess necessary to complete eval    OT Frequency Other (comment)   3 month f/u and call PRN   OT Duration Other (comment)    OT Treatment/Interventions Self-care/ADL training;Therapeutic exercise;Manual Therapy;Manual lymph drainage;Therapeutic activities;Coping strategies training;DME and/or AE instruction;Compression bandaging;Patient/family education    Plan Complete Decongestive Therapy (CDT) to LLE: MLD, skin care, ther ex and compression - initially utilize short stretch ompression  wraps to reduce volume, then fit with appropriate compression garment. Consider full length garment as swelling in L thigh is obvious.    OT Home Exercise Plan lymphatic pumping ther ex-  2 sets of 10 reps each exercise in order-bilaterally    Recommended Other Services Consider fitting with Flexitouch advanced sequential pneumatic compression device   to assist with long term LE self management at home.    Consulted and Agree with Plan of Care Patient             Patient will benefit from skilled therapeutic intervention in order to improve the following deficits and impairments:   Body Structure / Function / Physical Skills: ADL, Edema, Skin integrity, Balance, Mobility, Pain, Decreased knowledge of precautions, ROM, Decreased knowledge of use of DME, Scar mobility, IADL       Visit Diagnosis: Lymphedema, not elsewhere classified    Problem List Patient Active Problem List   Diagnosis Date Noted   Pre-operative exam    Duodenitis    Acute gastritis without hemorrhage    Encounter for screening colonoscopy    Benign neoplasm of ascending colon    Painful orthopaedic hardware ([place]) 05/20/2014   OA (osteoarthritis) of hip 03/06/2013    Andrey Spearman, MS, OTR/L, [facility] 12/24/20 2:39 PM   [place] [address] [place], [place], [address] Phone: [phone]   Fax:  [phone]  Name: MACLAIN COHRON MRN: [id number] Date of Birth: 04/17/1950

## 2020-12-31 ENCOUNTER — Ambulatory Visit: Payer: BC Managed Care – PPO | Admitting: Occupational Therapy

## 2021-01-07 ENCOUNTER — Ambulatory Visit: Payer: BC Managed Care – PPO | Admitting: Occupational Therapy

## 2021-01-14 ENCOUNTER — Encounter: Payer: Medicare Other | Admitting: Occupational Therapy

## 2021-01-21 ENCOUNTER — Encounter: Payer: Medicare Other | Admitting: Occupational Therapy

## 2021-01-28 ENCOUNTER — Encounter: Payer: Medicare Other | Admitting: Occupational Therapy

## 2021-02-04 ENCOUNTER — Encounter: Payer: Medicare Other | Admitting: Occupational Therapy

## 2021-03-25 ENCOUNTER — Ambulatory Visit: Payer: BC Managed Care – PPO | Attending: Vascular Surgery | Admitting: Occupational Therapy

## 2021-08-24 ENCOUNTER — Other Ambulatory Visit: Payer: Self-pay | Admitting: Family Medicine

## 2021-08-24 DIAGNOSIS — M5412 Radiculopathy, cervical region: Secondary | ICD-10-CM

## 2021-08-30 ENCOUNTER — Ambulatory Visit
Admission: RE | Admit: 2021-08-30 | Discharge: 2021-08-30 | Disposition: A | Discharge status: Home / Self Care | Payer: BC Managed Care – PPO | Source: Ambulatory Visit | Attending: Family Medicine | Admitting: Family Medicine

## 2021-08-30 DIAGNOSIS — M5412 Radiculopathy, cervical region: Secondary | ICD-10-CM

## 2022-01-31 ENCOUNTER — Telehealth: Payer: Self-pay

## 2022-01-31 NOTE — Telephone Encounter (Signed)
-----   Message from Peggyann Shoals sent at 01/31/2022 10:53 AM EDT ----- Regarding: [facility] fu Contact: [phone] Patient seen in at [facility] ER on 01/28/22 for severe back pain. He had an xray. He would like Dr.Yarbrough to review the results and recommend the next step.  FINDINGS: 5 nonrib-bearing lumbar-type vertebral bodies.  Mild retrolisthesis of L1 on L2. Mild multilevel degenerative disc disease with moderate to severe multilevel facet arthropathy most pronounced at L4-L5 and L5-S1. Vertebral body heights are preserved. Multiple surgical clips project over the left anterior aspect of the lumbar spine.

## 2022-01-31 NOTE — Telephone Encounter (Signed)
It appears that his last MRI was in 2020, could you offer him an an appt with Dr. Izora Ribas or with Marzetta Board?  Has he had any recent PT or Lumbar ESIs?

## 2022-02-02 NOTE — Telephone Encounter (Signed)
02/17/22 with Marzetta Board, patient confirmed

## 2022-02-14 ENCOUNTER — Ambulatory Visit
Admission: RE | Admit: 2022-02-14 | Discharge: 2022-02-14 | Disposition: A | Discharge status: Home / Self Care | Payer: Self-pay | Source: Ambulatory Visit | Attending: Neurosurgery | Admitting: Neurosurgery

## 2022-02-14 ENCOUNTER — Other Ambulatory Visit: Payer: Self-pay

## 2022-02-14 DIAGNOSIS — Z049 Encounter for examination and observation for unspecified reason: Secondary | ICD-10-CM
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
## 2022-02-17 ENCOUNTER — Encounter: Payer: Self-pay | Admitting: Orthopedic Surgery

## 2022-02-17 ENCOUNTER — Ambulatory Visit (INDEPENDENT_AMBULATORY_CARE_PROVIDER_SITE_OTHER): Payer: BC Managed Care – PPO | Admitting: Orthopedic Surgery

## 2022-02-17 VITALS — BP 128/74 | HR 71 | Ht 72.0 in | Wt 281.6 lb

## 2022-02-17 DIAGNOSIS — M4316 Spondylolisthesis, lumbar region: Secondary | ICD-10-CM | POA: Diagnosis not present

## 2022-02-17 DIAGNOSIS — M47816 Spondylosis without myelopathy or radiculopathy, lumbar region: Secondary | ICD-10-CM

## 2022-02-17 NOTE — Patient Instructions (Signed)
It was so nice to see you today, I am sorry that you are hurting so much.   Your lumbar xrays showed wear and tear (arthritis) and I think this may be causing your pain.   I sent physical therapy orders to [facility] in [place]. You need to call them to schedule [phone]. Let me know if they can't get you seen quickly.   I will check with Dr. Sharlet Salina to see if he needs a lumbar MRI prior to considering lumbar injections. I will let you know what he says.  I will see you back in 6 weeks. Please do not hesitate to call if you have any questions or concerns. You can also message me in [place].   Geronimo Boot PA-C [phone]

## 2022-02-22 ENCOUNTER — Telehealth: Payer: Self-pay

## 2022-02-22 NOTE — Telephone Encounter (Signed)
-----   Message from Peggyann Shoals sent at 02/22/2022 11:01 AM EST ----- Regarding: injection question Contact: [phone] Marzetta Board told patient that she was going to ask Dr.Chasnis if he could do an injection with out patient having to do PT. So patient was just calling to see if Marzetta Board had spoken to Dr.Chasnis. I spoke to [place] with [facility] PT and she said that she was going to call the patient today and schedule him.

## 2022-02-22 NOTE — Telephone Encounter (Signed)
Left message a message that we will call him back once we hear from Dr.Chasnis.

## 2022-02-22 NOTE — Telephone Encounter (Signed)
Let him know I sent another message to Dr. Sharlet Salina and will let him know when we hear back.

## 2022-02-22 NOTE — Telephone Encounter (Signed)
Please let the patient know

## 2022-02-23 ENCOUNTER — Telehealth: Payer: Self-pay | Admitting: Orthopedic Surgery

## 2022-02-23 DIAGNOSIS — Z9889 Other specified postprocedural states: Secondary | ICD-10-CM

## 2022-02-23 DIAGNOSIS — M47816 Spondylosis without myelopathy or radiculopathy, lumbar region: Secondary | ICD-10-CM

## 2022-02-23 DIAGNOSIS — M4316 Spondylolisthesis, lumbar region: Secondary | ICD-10-CM

## 2022-02-23 NOTE — Telephone Encounter (Signed)
Patient has been notified and would like to have his MRI done at [facility]. He verbalized understanding on all.

## 2022-02-23 NOTE — Telephone Encounter (Signed)
I heard back from Dr. Sharlet Salina. He would like an updated lumbar MRI prior to considering lumbar injections.   Please let patient know. I ordered the MRI and they will call him to schedule.   We can do phone visit to review MRI and once I get the results, I can put in referral for him to see Chasnis.     He is s/p L2-3, L3-4, L4-5 decompression by Dr. Izora Ribas on 12/13/2019    He has known lumbar DDD with retrolisthesis L1-L3 anf facet hypertrophy at L4-S1. LBP likely from underling DDD/facet hypertrophy.

## 2022-03-08 NOTE — Telephone Encounter (Signed)
Patient got a letter from his insurance that his MRI was approved. Can she call [facility] or should he wait for them to call him?

## 2022-03-08 NOTE — Telephone Encounter (Signed)
He can call them and get this scheduled. I sent this message to someone in scheduling and it looks like they never reached out to him. I'm sorry it has taken this long.

## 2022-03-13 ENCOUNTER — Ambulatory Visit
Admission: RE | Admit: 2022-03-13 | Discharge: 2022-03-13 | Disposition: A | Discharge status: Home / Self Care | Payer: BC Managed Care – PPO | Source: Ambulatory Visit | Attending: Orthopedic Surgery | Admitting: Orthopedic Surgery

## 2022-03-13 DIAGNOSIS — M4316 Spondylolisthesis, lumbar region: Secondary | ICD-10-CM | POA: Diagnosis present

## 2022-03-13 DIAGNOSIS — Z9889 Other specified postprocedural states: Secondary | ICD-10-CM | POA: Diagnosis present

## 2022-03-13 DIAGNOSIS — M47816 Spondylosis without myelopathy or radiculopathy, lumbar region: Secondary | ICD-10-CM | POA: Insufficient documentation

## 2022-03-16 ENCOUNTER — Ambulatory Visit (INDEPENDENT_AMBULATORY_CARE_PROVIDER_SITE_OTHER): Payer: BC Managed Care – PPO | Admitting: Orthopedic Surgery

## 2022-03-16 ENCOUNTER — Encounter: Payer: Self-pay | Admitting: Orthopedic Surgery

## 2022-03-16 DIAGNOSIS — M4316 Spondylolisthesis, lumbar region: Secondary | ICD-10-CM

## 2022-03-16 DIAGNOSIS — M47816 Spondylosis without myelopathy or radiculopathy, lumbar region: Secondary | ICD-10-CM

## 2022-03-16 DIAGNOSIS — Z9889 Other specified postprocedural states: Secondary | ICD-10-CM
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
## 2022-03-31 ENCOUNTER — Encounter: Payer: Self-pay | Admitting: Emergency Medicine

## 2022-03-31 ENCOUNTER — Ambulatory Visit
Admission: EM | Admit: 2022-03-31 | Discharge: 2022-03-31 | Disposition: A | Discharge status: Home / Self Care | Payer: BC Managed Care – PPO | Attending: Emergency Medicine | Admitting: Emergency Medicine

## 2022-03-31 DIAGNOSIS — J069 Acute upper respiratory infection, unspecified: Secondary | ICD-10-CM

## 2022-03-31 MED ORDER — IPRATROPIUM BROMIDE 0.06 % NA SOLN: 2.0000 | Freq: Four times a day (QID) | NASAL | 12 refills | Status: DC

## 2022-03-31 MED ORDER — PROMETHAZINE-DM 6.25-15 MG/5ML PO SYRP: 5.0000 mL | ORAL_SOLUTION | Freq: Four times a day (QID) | ORAL | 0 refills | Status: DC | PRN

## 2022-03-31 MED ORDER — BENZONATATE 100 MG PO CAPS: 200.0000 mg | ORAL_CAPSULE | Freq: Three times a day (TID) | ORAL | 0 refills | Status: DC

## 2022-03-31 NOTE — ED Triage Notes (Signed)
Pt c/o cough, headache, chest congestion, runny nose, chest tightness. Started about a week ago. He states he had a fever about 2 days ago but has resolved.

## 2022-03-31 NOTE — ED Provider Notes (Signed)
[facility]    CSN: [id number] Arrival date & time: 03/31/22  1705      History   Chief Complaint Chief Complaint  Patient presents with   Cough   Nasal Congestion    HPI Paul Byrd is a 71 y.o. male.   HPI  71 year old male here for evaluation of respiratory complaints.  Patient reports that last week he has been experiencing a headache, runny nose with clear nasal discharge, chest tightness, cough that is intermittently productive for white sputum, shortness breath, and wheezing.  He states that he did have a fever earlier in the week but does not have a fever for last several days.  He denies any sore throat.  He states his wife has similar symptoms.  Past Medical History:  Diagnosis Date   Arthritis    Cancer ([place]) 2001   kidney-left(surgical removal only)   Chronic kidney disease    nephrectomy left kidney-cancer   Headache(784.0)    rare now    Patient Active Problem List   Diagnosis Date Noted   Pre-operative exam    Duodenitis    Acute gastritis without hemorrhage    Encounter for screening colonoscopy    Benign neoplasm of ascending colon    Painful orthopaedic hardware ([place]) 05/20/2014   OA (osteoarthritis) of hip 03/06/2013    Past Surgical History:  Procedure Laterality Date   COLONOSCOPY WITH PROPOFOL N/A 12/05/2017   Procedure: COLONOSCOPY WITH PROPOFOL;  Surgeon: [name], MD;  Location: [facility] ENDOSCOPY;  Service: Endoscopy;  Laterality: N/A;   ESOPHAGOGASTRODUODENOSCOPY (EGD) WITH PROPOFOL N/A 12/05/2017   Procedure: ESOPHAGOGASTRODUODENOSCOPY (EGD) WITH PROPOFOL;  Surgeon: [name], MD;  Location: [facility] ENDOSCOPY;  Service: Endoscopy;  Laterality: N/A;   HARDWARE REMOVAL Right 05/20/2014   Procedure: RIGHT HIP HARDWARE REMOVAL;  Surgeon: [name], MD;  Location: [facility]S;  Service: Orthopedics;  Laterality: Right;   JOINT REPLACEMENT     right hip x 2   NEPHRECTOMY  2001   left kidney for cancer   scar tissue  removal     right chin   SHOULDER SURGERY     right x 2, left x 1   TONSILLECTOMY     TOTAL HIP ARTHROPLASTY Left 03/06/2013   Procedure: LEFT TOTAL HIP ARTHROPLASTY ANTERIOR APPROACH;  Surgeon: [name], MD;  Location: [facility];  Service: Orthopedics;  Laterality: Left;       Home Medications    Prior to Admission medications   Medication Sig Start Date End Date Taking? Authorizing Provider  allopurinol (ZYLOPRIM) 300 MG tablet Take 300 mg by mouth daily with breakfast.   Yes [provider]  benzonatate (TESSALON) 100 MG capsule Take 2 capsules (200 mg total) by mouth every 8 (eight) hours. 03/31/22  Yes Margarette Canada, NP  gabapentin (NEURONTIN) 300 MG capsule Take 600 mg by mouth at bedtime. 07/11/19  Yes [provider]  ipratropium (ATROVENT) 0.06 % nasal spray Place 2 sprays into both nostrils 4 (four) times daily. 03/31/22  Yes Margarette Canada, NP  Multiple Vitamin (MULTI-VITAMIN) tablet Take 1 tablet by mouth daily.   Yes [provider]  promethazine-dextromethorphan (PROMETHAZINE-DM) 6.25-15 MG/5ML syrup Take 5 mLs by mouth 4 (four) times daily as needed. 03/31/22  Yes Margarette Canada, NP  tamsulosin (FLOMAX) 0.4 MG CAPS capsule Take 0.4 mg by mouth daily.   Yes [provider]  venlafaxine XR (EFFEXOR-XR) 150 MG 24 hr capsule Take 150 mg by mouth daily with breakfast. 06/08/21  Yes [provider]  acetaminophen (TYLENOL) 500 MG tablet Take 1,000 mg by mouth every 6 (six) hours as needed for headache.    [provider]    Family History No family history on file.  Social History Social History   Tobacco Use   Smoking status: Former    Types: Cigarettes    Quit date: 05/27/1999    Years since quitting: 22.8  Substance Use Topics   Alcohol use: Yes    Comment: occassionally   Drug use: No     Allergies   Ibuprofen   Review of Systems Review of Systems  Constitutional:  Positive for fever.  HENT:  Positive  for congestion and rhinorrhea. Negative for ear pain and sore throat.   Respiratory:  Positive for cough, shortness of breath and wheezing.      Physical Exam Triage Vital Signs ED Triage Vitals  Enc Vitals Group     BP 03/31/22 1900 116/80     Pulse Rate 03/31/22 1900 67     Resp 03/31/22 1900 16     Temp 03/31/22 1900 98.4 F (36.9 C)     Temp Source 03/31/22 1900 Oral     SpO2 03/31/22 1900 97 %     Weight 03/31/22 1859 281 lb 8.4 oz (127.7 kg)     Height 03/31/22 1859 6' (1.829 m)     Head Circumference --      Peak Flow --      Pain Score 03/31/22 1858 0     Pain Loc --      Pain Edu? --      Excl. in [place]? --    No data found.  Updated Vital Signs BP 116/80 (BP Location: Left Arm)   Pulse 67   Temp 98.4 F (36.9 C) (Oral)   Resp 16   Ht 6' (1.829 m)   Wt 281 lb 8.4 oz (127.7 kg)   SpO2 97%   BMI 38.18 kg/m   Visual Acuity Right Eye Distance:   Left Eye Distance:   Bilateral Distance:    Right Eye Near:   Left Eye Near:    Bilateral Near:     Physical Exam Vitals and nursing note reviewed.  Constitutional:      Appearance: Normal appearance. He is not ill-appearing.  HENT:     Head: Normocephalic and atraumatic.     Right Ear: Tympanic membrane, ear canal and external ear normal. There is no impacted cerumen.     Left Ear: Tympanic membrane, ear canal and external ear normal. There is no impacted cerumen.     Nose: Congestion and rhinorrhea present.     Comments: Nasal mucosa is erythematous and edematous with clear discharge in both nares.    Mouth/Throat:     Mouth: Mucous membranes are moist.     Pharynx: Oropharynx is clear. Posterior oropharyngeal erythema present. No oropharyngeal exudate.     Comments: Posterior oropharynx is erythematous and injected with clear postnasal drip. Cardiovascular:     Rate and Rhythm: Normal rate and regular rhythm.     Pulses: Normal pulses.     Heart sounds: Normal heart sounds. No murmur heard.    No friction  rub. No gallop.  Pulmonary:     Effort: Pulmonary effort is normal.     Breath sounds: Normal breath sounds. No wheezing, rhonchi or rales.  Musculoskeletal:     Cervical back: Normal range of motion and neck supple.  Lymphadenopathy:  Cervical: No cervical adenopathy.  Skin:    General: Skin is warm and dry.     Capillary Refill: Capillary refill takes less than 2 seconds.     Findings: No erythema or rash.  Neurological:     General: No focal deficit present.     Mental Status: He is alert and oriented to person, place, and time.  Psychiatric:        Mood and Affect: Mood normal.        Behavior: Behavior normal.        Thought Content: Thought content normal.        Judgment: Judgment normal.      UC Treatments / Results  Labs (all labs ordered are listed, but only abnormal results are displayed) Labs Reviewed - No data to display  EKG   Radiology No results found.  Procedures Procedures (including critical care time)  Medications Ordered in UC Medications - No data to display  Initial Impression / Assessment and Plan / UC Course  I have reviewed the triage vital signs and the nursing notes.  Pertinent labs & imaging results that were available during my care of the patient were reviewed by me and considered in my medical decision making (see chart for details).   Patient is a pleasant, nontoxic-appearing 71 year old male here for evaluation of 1 week worth of respiratory complaints as outlined HPI above.  His physical exam does reveal the presence of upper respiratory tract inflammation.  His lung sounds are clear to auscultation all fields.  He is able to speak in full sentences without any dyspnea or tachypnea.  I suspect the patient is a viral upper respiratory infection.  I will treat him with Atrovent nasal spray, Tessalon Perles, Promethazine DM cough syrup.  I do not feel he needs an antibiotic at this time but I did encourage the patient to return if his  symptoms worsen.  Return precautions reviewed.   Final Clinical Impressions(s) / UC Diagnoses   Final diagnoses:  Viral URI with cough     Discharge Instructions      Use the Atrovent nasal spray, 2 squirts in each nostril every 6 hours, as needed for runny nose and postnasal drip.  Use the Tessalon Perles every 8 hours during the day.  Take them with a small sip of water.  They may give you some numbness to the base of your tongue or a metallic taste in your mouth, this is normal.  Use the Promethazine DM cough syrup at bedtime for cough and congestion.  It will make you drowsy so do not take it during the day.  Return for reevaluation or see your primary care provider for any new or worsening symptoms.      ED Prescriptions     Medication Sig Dispense Auth. Provider   benzonatate (TESSALON) 100 MG capsule Take 2 capsules (200 mg total) by mouth every 8 (eight) hours. 21 capsule Margarette Canada, NP   ipratropium (ATROVENT) 0.06 % nasal spray Place 2 sprays into both nostrils 4 (four) times daily. 15 mL Margarette Canada, NP   promethazine-dextromethorphan (PROMETHAZINE-DM) 6.25-15 MG/5ML syrup Take 5 mLs by mouth 4 (four) times daily as needed. 118 mL Margarette Canada, NP      PDMP not reviewed this encounter.   Margarette Canada, NP 03/31/22 1918

## 2022-03-31 NOTE — Discharge Instructions (Signed)

## 2022-04-01 ENCOUNTER — Ambulatory Visit: Payer: Medicare Other | Admitting: Orthopedic Surgery
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
## 2022-05-03 ENCOUNTER — Ambulatory Visit (INDEPENDENT_AMBULATORY_CARE_PROVIDER_SITE_OTHER): Payer: BC Managed Care – PPO | Admitting: Orthopedic Surgery

## 2022-05-03 ENCOUNTER — Encounter: Payer: Self-pay | Admitting: Orthopedic Surgery

## 2022-05-03 VITALS — BP 124/80 | Ht 72.0 in | Wt 279.4 lb

## 2022-05-03 DIAGNOSIS — M47816 Spondylosis without myelopathy or radiculopathy, lumbar region: Secondary | ICD-10-CM

## 2022-05-03 DIAGNOSIS — M4316 Spondylolisthesis, lumbar region: Secondary | ICD-10-CM

## 2022-05-03 DIAGNOSIS — Z9889 Other specified postprocedural states: Secondary | ICD-10-CM

## 2022-05-12 HISTORY — PX: TOTAL ANKLE ARTHROPLASTY: SHX811

## 2022-06-08 NOTE — Progress Notes (Signed)
Telephone Visit- Progress Note: Referring Physician:  Idelle Crouch, MD [place] [address] [place],  [place] [id number]  Primary Physician:  Paul Crouch, MD  This visit was performed via telephone.  Patient location: home Provider location: office  I spent a total of 10 minutes non-face-to-face activities for this visit on the date of this encounter including review of current clinical condition and response to treatment.    Patient has given verbal consent to this telephone visits and we reviewed the limitations of a telephone visit. Patient wishes to proceed.    Chief Complaint:  follow up visit  History of Present Illness: Paul Byrd is a 72 y.o. male has a history of kidney CA with nephrectomy, BPH, HLD, and OSA.    Procedure: L2-3, L3-4, L4-5 decompression by Dr. Izora Ribas.  Date of Procedure: 12/13/2019    Last had phone visit with me on 05/03/22. He has known multilevel spondylosis with facet hypertrophy L1-S1. He also has known lumbar DDD with retrolisthesis L1-L3 and facet hypertrophy at L4-S1. LBP likely from underling DDD/facet hypertrophy.   He continued with LBP at last visit with no leg pain. He was to continue with PT (did not do any further visits) and follow up with Dr. Sharlet Salina for a second lumbar MBB- this is scheduled for 06/21/22.   He has left total ankle replacement at [facility] on 05/26/22. He is NWB on his left LE in a cast.   He is here for follow up.   He has constant LBP that varies in severity. He is using a knee scooter and this makes his pain worse. No leg pain. He is mostly taking tylenol for his ankle pain. He takes occasional oxycodone.    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 .    Conservative measures:  Physical therapy: 8 visits at [facility] in [place] from 03/22/22 to 05/02/22 Multimodal medical therapy including regular antiinflammatories: neurontin, tylenol  Injections: 04/20/2022: Bilateral L3 and L4 medial branch  and L5 dorsal ramus blocks  11/30/2021: RFA to the right C2-3 and C3-4 facet joints (30% relief) 10/14/2021: MBB to the right C2-3 and C3-4 facet joints (8/10 to 0/10) 09/30/2021: MBB to the right C2-3 and C3-4 facet joints (8/10 to 1-2/10) 09/17/2021: Right C7 trigger point injection (1 week of good relief then return of pain) 07/02/2021: Right C4-5 transforaminal ESI (little relief) 03/19/2021: Right C4-5 TF ESI (80% relief) 06/03/2020: Bilateral C5 trigger point injections (relief x3 days) 12/13/2019: L2-3, L3-4, L4-5 decompression (Dr. Izora Ribas) 08/23/2018: Bilateral L4-5 and L5-S1 radiofrequency neurotomy (70% relief) 08/02/2018: Bilateral L4-5 and L5-S1 medial branch blocks (6/10 to 0/10) 06/14/2018: Bilateral L4 5 and L5-S1 medial branch blocks (3/10 to 0/10 x1 day) 05/18/2018: Bilateral L4-5 and L5-S1 facet joint injections (good relief x4 days then increase in pain) 03/01/2017: L3-4, L4-5 decompression (Dr. Izora Ribas) 01/19/2017: Bilateral L5-S1 transforaminal ESI (mild relief) 12/25/2016: Right L3-4 transforaminal ESI (improvement of anterior thigh pain) 12/01/2016: Right L2-3 and Right L5-S1 transforaminal ESI (mild to moderate relief)  Past Surgery: L2-3, L3-4, L4-5 decompression by Dr. Izora Ribas on 12/13/2019     Exam: No exam done as this was a telephone encounter.     Imaging: none  I have personally reviewed the images and agree with the above interpretation.  Assessment and Plan: [name] is a pleasant 72 y.o. male with constant LBP that is worse with using knee scooter (had left ankle replacement 05/26/22 and he is NWB left leg). He has  no leg pain.    He is s/p L2-3, L3-4, L4-5 decompression by Dr. Izora Ribas on 12/13/2019    He has known multilevel spondylosis with facet hypertrophy L1-S1. He also has known lumbar DDD with retrolisthesis L1-L3 and facet hypertrophy at L4-S1. LBP likely from underling DDD/facet hypertrophy.   He had good improvement with PT and  MBB. Scheduled for repeat MBB on 06/21/22.    Treatment options discussed with patient and following plan made:   - Discussed that using knee scooter and being NWB on left leg would aggravate his back pain.  - Agree with repeat MBB with Dr. Sharlet Salina on 06/21/22. - Expect improvement in pain once we can WB on left leg- he has f/u mid month with surgeon.  - I will message him in 4-6 weeks to check on him.    Geronimo Boot PA-C Dept. of Neurosurgery

## 2022-06-15 ENCOUNTER — Encounter: Payer: Self-pay | Admitting: Orthopedic Surgery

## 2022-06-15 ENCOUNTER — Ambulatory Visit (INDEPENDENT_AMBULATORY_CARE_PROVIDER_SITE_OTHER): Payer: BC Managed Care – PPO | Admitting: Orthopedic Surgery

## 2022-06-15 DIAGNOSIS — M47816 Spondylosis without myelopathy or radiculopathy, lumbar region: Secondary | ICD-10-CM | POA: Diagnosis not present

## 2022-06-15 DIAGNOSIS — M4316 Spondylolisthesis, lumbar region: Secondary | ICD-10-CM

## 2022-06-15 DIAGNOSIS — Z9889 Other specified postprocedural states: Secondary | ICD-10-CM

## 2022-08-23 ENCOUNTER — Other Ambulatory Visit: Payer: Self-pay | Admitting: Family Medicine

## 2022-08-23 DIAGNOSIS — M5412 Radiculopathy, cervical region: Secondary | ICD-10-CM

## 2022-08-27 ENCOUNTER — Ambulatory Visit
Admission: RE | Admit: 2022-08-27 | Discharge: 2022-08-27 | Disposition: A | Discharge status: Home / Self Care | Payer: BC Managed Care – PPO | Source: Ambulatory Visit | Attending: Family Medicine | Admitting: Family Medicine

## 2022-08-27 DIAGNOSIS — M5412 Radiculopathy, cervical region: Secondary | ICD-10-CM

## 2022-09-09 ENCOUNTER — Other Ambulatory Visit: Payer: Self-pay | Admitting: *Deleted

## 2022-09-09 ENCOUNTER — Telehealth: Payer: Self-pay | Admitting: *Deleted

## 2022-09-09 ENCOUNTER — Encounter: Payer: Self-pay | Admitting: *Deleted

## 2022-09-09 DIAGNOSIS — Z86010 Personal history of colonic polyps: Secondary | ICD-10-CM

## 2022-09-09 MED ORDER — PEG 3350-KCL-NABCB-NACL-NASULF 236 G PO SOLR: 4000.0000 mL | Freq: Once | ORAL | 0 refills | Status: AC

## 2022-09-09 NOTE — Telephone Encounter (Signed)
Gastroenterology Pre-Procedure Review  Request Date: 12/20/2022  Requesting Physician: Dr. Servando Snare  PATIENT REVIEW QUESTIONS: The patient responded to the following health history questions as indicated:    1. Are you having any GI issues? no 2. Do you have a personal history of Polyps? yes (12/05/2017) 3. Do you have a family history of Colon Cancer or Polyps? no 4. Diabetes Mellitus? no 5. Joint replacements in the past 12 months?yes (ankle in Feburary) 6. Major health problems in the past 3 months?no 7. Any artificial heart valves, MVP, or defibrillator?no    MEDICATIONS & ALLERGIES:    Patient reports the following regarding taking any anticoagulation/antiplatelet therapy:   Plavix, Coumadin, Eliquis, Xarelto, Lovenox, Pradaxa, Brilinta, or Effient? no Aspirin? yes (81 mg)  Patient confirms/reports the following medications:  Current Outpatient Medications  Medication Sig Dispense Refill   polyethylene glycol (GOLYTELY) 236 g solution Take 4,000 mLs by mouth once for 1 dose. 4000 mL 0   acetaminophen (TYLENOL) 500 MG tablet Take 1,000 mg by mouth every 6 (six) hours as needed for headache.     allopurinol (ZYLOPRIM) 300 MG tablet Take 300 mg by mouth daily with breakfast.     gabapentin (NEURONTIN) 300 MG capsule Take 600 mg by mouth at bedtime.     Multiple Vitamin (MULTI-VITAMIN) tablet Take 1 tablet by mouth daily.     oxyCODONE (OXY IR/ROXICODONE) 5 MG immediate release tablet Take 5 mg by mouth every 6 (six) hours as needed.     tamsulosin (FLOMAX) 0.4 MG CAPS capsule Take 0.4 mg by mouth daily.     venlafaxine XR (EFFEXOR-XR) 150 MG 24 hr capsule Take 150 mg by mouth daily with breakfast.     No current facility-administered medications for this visit.    Patient confirms/reports the following allergies:  Allergies  Allergen Reactions   Ibuprofen Other (See Comments)    Due to patient having one kidney    No orders of the defined types were placed in this  encounter.   AUTHORIZATION INFORMATION Primary Insurance: 1D#: Group #:  Secondary Insurance: 1D#: Group #:  SCHEDULE INFORMATION: Date: 12/20/2022 Time: Location:  [facility]

## 2022-09-09 NOTE — Telephone Encounter (Signed)
error 

## 2022-09-19 NOTE — H&P (View-Only) (Signed)
Referring Physician:  Denton Lank, FNP [address] [place],  [place] [address]  Primary Physician:  Paul Arbour, MD  History of Present Illness: 09/20/2022 Paul Byrd is here today with a chief complaint of neck and pain into his trapezius muscles and shoulders.  It can be as bad as 9 out of 10 and is made worse by moving his neck.  Nothing is really helped.  He has been having pain for years but is been worsening over time.  Bowel/Bladder Dysfunction: none  Conservative measures:  Physical therapy:  has participated in Multimodal medical therapy including regular antiinflammatories:  tylenol, gabapentin, oxycodone, tramadol Injections: has received epidural steroid injections 07/28/2022: Right C4-5 transforaminal ESI (no relief)  11/30/2021: RFA to the right C2-3 and C3-4 facet joints (30% relief) 10/14/2021: MBB to the right C2-3 and C3-4 facet joints (8/10 to 0/10) 09/30/2021: MBB to the right C2-3 and C3-4 facet joints (8/10 to 1-2/10) 09/17/2021: Right C7 trigger point injection (1 week of good relief then return of pain) 07/02/2021: Right C4-5 transforaminal ESI (little relief) 03/19/2021: Right C4-5 TF ESI (80% relief) 06/03/2020: Bilateral C5 trigger point injections (relief x3 days)   Past Surgery:  12/13/19: L2-5 Decompression 02/28/17: L3-5 Decompression  Paul Byrd has no symptoms of cervical myelopathy.  The symptoms are causing a significant impact on the patient's life.   I have utilized the care everywhere function in epic to review the outside records available from external health systems.  Review of Systems:  A 10 point review of systems is negative, except for the pertinent positives and negatives detailed in the HPI.  Past Medical History: Past Medical History:  Diagnosis Date   Arthritis    Cancer (HCC) 2001   kidney-left(surgical removal only)   Chronic kidney disease    nephrectomy left kidney-cancer    Headache(784.0)    rare now    Past Surgical History: Past Surgical History:  Procedure Laterality Date   COLONOSCOPY WITH PROPOFOL N/A 12/05/2017   Procedure: COLONOSCOPY WITH PROPOFOL;  Surgeon: Paul Minium, MD;  Location: [facility] ENDOSCOPY;  Service: Endoscopy;  Laterality: N/A;   ESOPHAGOGASTRODUODENOSCOPY (EGD) WITH PROPOFOL N/A 12/05/2017   Procedure: ESOPHAGOGASTRODUODENOSCOPY (EGD) WITH PROPOFOL;  Surgeon: Paul Minium, MD;  Location: [facility] ENDOSCOPY;  Service: Endoscopy;  Laterality: N/A;   HARDWARE REMOVAL Right 05/20/2014   Procedure: RIGHT HIP HARDWARE REMOVAL;  Surgeon: Paul Drilling, MD;  Location: [facility];  Service: Orthopedics;  Laterality: Right;   JOINT REPLACEMENT     right hip x 2   NEPHRECTOMY  2001   left kidney for cancer   scar tissue removal     right chin   SHOULDER SURGERY     right x 2, left x 1   TONSILLECTOMY     TOTAL HIP ARTHROPLASTY Left 03/06/2013   Procedure: LEFT TOTAL HIP ARTHROPLASTY ANTERIOR APPROACH;  Surgeon: Paul Drilling, MD;  Location: [facility];  Service: Orthopedics;  Laterality: Left;    Allergies: Allergies as of 09/20/2022 - Review Complete 09/20/2022  Allergen Reaction Noted   Ibuprofen Other (See Comments) 02/17/2022    Medications:  Current Outpatient Medications:    acetaminophen (TYLENOL) 500 MG tablet, Take 1,000 mg by mouth every 6 (six) hours as needed for headache., Disp: , Rfl:    allopurinol (ZYLOPRIM) 300 MG tablet, Take 300 mg by mouth daily with breakfast., Disp: , Rfl:    gabapentin (NEURONTIN) 300 MG capsule, Take 600 mg by mouth at bedtime.,  Disp: , Rfl:    Multiple Vitamin (MULTI-VITAMIN) tablet, Take 1 tablet by mouth daily., Disp: , Rfl:    oxyCODONE (OXY IR/ROXICODONE) 5 MG immediate release tablet, Take 5 mg by mouth every 6 (six) hours as needed., Disp: , Rfl:    tamsulosin (FLOMAX) 0.4 MG CAPS capsule, Take 0.4 mg by mouth daily., Disp: , Rfl:    venlafaxine XR (EFFEXOR-XR) 150 MG 24 hr capsule, Take 150 mg by  mouth daily with breakfast., Disp: , Rfl:   Social History: Social History   Tobacco Use   Smoking status: Former    Types: Cigarettes    Quit date: 05/27/1999    Years since quitting: 23.3  Substance Use Topics   Alcohol use: Yes    Comment: occassionally   Drug use: No    Family Medical History: No family history on file.  Physical Examination: Vitals:   09/20/22 0825  BP: 115/78    General: Patient is in no apparent distress. Attention to examination is appropriate.  Neck:   Supple.  Full range of motion with discomfort on extension as well as rotation.  Respiratory: Patient is breathing without any difficulty.   NEUROLOGICAL:     Awake, alert, oriented to person, place, and time.  Speech is clear and fluent.   Cranial Nerves: Pupils equal round and reactive to light.  Facial tone is symmetric.  Facial sensation is symmetric. Shoulder shrug is symmetric. Tongue protrusion is midline.  There is no pronator drift.  Strength: Side Biceps Triceps Deltoid Interossei Grip Wrist Ext. Wrist Flex.  R 5 5 5 5 5 5 5   L 5 5 5 5 5 5 5    Side Iliopsoas Quads Hamstring PF DF EHL  R 5 5 5 5 5 5   L 5 5 5 5 5 5    Reflexes are 1+ and symmetric at the biceps, triceps, brachioradialis, patella and achilles.   Hoffman's is absent.   Bilateral upper and lower extremity sensation is intact to light touch.    No evidence of dysmetria noted.  Gait is normal.     Medical Decision Making  Imaging: MRI C spine 08/27/2022 IMPRESSION: 1. Slight progression of right foraminal stenosis at C2-3. 2. Progressive moderate right foraminal stenosis at C4-5. 3. Stable mild left foraminal narrowing at C4-5 and C5-6. 4. Moderate left and right foraminal stenosis at C3-4 is stable. 5. Mild left uncovertebral spurring at C6-7 without significant stenosis or change.     Electronically Signed   By: Paul Byrd M.D.   On: 08/27/2022 16:02  I have personally reviewed the images and  agree with the above interpretation.  Assessment and Plan: [name] is a pleasant 72 y.o. male with symptoms concerning for possible C4 and C5 radiculopathies.  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  He has substantial disc degenerative change at both levels.  I think this could be the cause of his pain.  He has tried and failed conservative management including physical therapy and injections.  At this point, no further conservative management is indicated.  I have recommended C3-5 anterior cervical discectomy and fusion.  I discussed the planned procedure at length with the patient, including the risks, benefits, alternatives, and indications. The risks discussed include but are not limited to bleeding, infection, need for reoperation, spinal fluid leak, stroke, vision loss, anesthetic complication, coma, paralysis, and even death. We also discussed the possibility of post-operative dysphagia, vocal cord paralysis, and the risk of  adjacent segment disease in the future. I also described in detail that improvement was not guaranteed.  The patient expressed understanding of these risks, and asked that we proceed with surgery. I described the surgery in layman's terms, and gave ample opportunity for questions, which were answered to the best of my ability.     Thank you for involving me in the care of this patient.      Othel Hoogendoorn K. Myer Haff MD, [facility]

## 2022-09-19 NOTE — Progress Notes (Unsigned)
Referring Physician:  Denton Lank, FNP [address] [place],  [place] [address]  Primary Physician:  Marguarite Arbour, MD  History of Present Illness: 09/20/2022 Paul Byrd is here today with a chief complaint of neck and pain into his trapezius muscles and shoulders.  It can be as bad as 9 out of 10 and is made worse by moving his neck.  Nothing is really helped.  He has been having pain for years but is been worsening over time.  Bowel/Bladder Dysfunction: none  Conservative measures:  Physical therapy:  has participated in Multimodal medical therapy including regular antiinflammatories:  tylenol, gabapentin, oxycodone, tramadol Injections: has received epidural steroid injections 07/28/2022: Right C4-5 transforaminal ESI (no relief)  11/30/2021: RFA to the right C2-3 and C3-4 facet joints (30% relief) 10/14/2021: MBB to the right C2-3 and C3-4 facet joints (8/10 to 0/10) 09/30/2021: MBB to the right C2-3 and C3-4 facet joints (8/10 to 1-2/10) 09/17/2021: Right C7 trigger point injection (1 week of good relief then return of pain) 07/02/2021: Right C4-5 transforaminal ESI (little relief) 03/19/2021: Right C4-5 TF ESI (80% relief) 06/03/2020: Bilateral C5 trigger point injections (relief x3 days)   Past Surgery:  12/13/19: L2-5 Decompression 02/28/17: L3-5 Decompression  Paul Byrd has no symptoms of cervical myelopathy.  The symptoms are causing a significant impact on the patient's life.   I have utilized the care everywhere function in epic to review the outside records available from external health systems.  Review of Systems:  A 10 point review of systems is negative, except for the pertinent positives and negatives detailed in the HPI.  Past Medical History: Past Medical History:  Diagnosis Date   Arthritis    Cancer (HCC) 2001   kidney-left(surgical removal only)   Chronic kidney disease    nephrectomy left kidney-cancer    Headache(784.0)    rare now    Past Surgical History: Past Surgical History:  Procedure Laterality Date   COLONOSCOPY WITH PROPOFOL N/A 12/05/2017   Procedure: COLONOSCOPY WITH PROPOFOL;  Surgeon: Midge Minium, MD;  Location: [facility] ENDOSCOPY;  Service: Endoscopy;  Laterality: N/A;   ESOPHAGOGASTRODUODENOSCOPY (EGD) WITH PROPOFOL N/A 12/05/2017   Procedure: ESOPHAGOGASTRODUODENOSCOPY (EGD) WITH PROPOFOL;  Surgeon: Midge Minium, MD;  Location: [facility] ENDOSCOPY;  Service: Endoscopy;  Laterality: N/A;   HARDWARE REMOVAL Right 05/20/2014   Procedure: RIGHT HIP HARDWARE REMOVAL;  Surgeon: Loanne Drilling, MD;  Location: [facility];  Service: Orthopedics;  Laterality: Right;   JOINT REPLACEMENT     right hip x 2   NEPHRECTOMY  2001   left kidney for cancer   scar tissue removal     right chin   SHOULDER SURGERY     right x 2, left x 1   TONSILLECTOMY     TOTAL HIP ARTHROPLASTY Left 03/06/2013   Procedure: LEFT TOTAL HIP ARTHROPLASTY ANTERIOR APPROACH;  Surgeon: Loanne Drilling, MD;  Location: [facility];  Service: Orthopedics;  Laterality: Left;    Allergies: Allergies as of 09/20/2022 - Review Complete 09/20/2022  Allergen Reaction Noted   Ibuprofen Other (See Comments) 02/17/2022    Medications:  Current Outpatient Medications:    acetaminophen (TYLENOL) 500 MG tablet, Take 1,000 mg by mouth every 6 (six) hours as needed for headache., Disp: , Rfl:    allopurinol (ZYLOPRIM) 300 MG tablet, Take 300 mg by mouth daily with breakfast., Disp: , Rfl:    gabapentin (NEURONTIN) 300 MG capsule, Take 600 mg by mouth at bedtime.,  Disp: , Rfl:    Multiple Vitamin (MULTI-VITAMIN) tablet, Take 1 tablet by mouth daily., Disp: , Rfl:    oxyCODONE (OXY IR/ROXICODONE) 5 MG immediate release tablet, Take 5 mg by mouth every 6 (six) hours as needed., Disp: , Rfl:    tamsulosin (FLOMAX) 0.4 MG CAPS capsule, Take 0.4 mg by mouth daily., Disp: , Rfl:    venlafaxine XR (EFFEXOR-XR) 150 MG 24 hr capsule, Take 150 mg by  mouth daily with breakfast., Disp: , Rfl:   Social History: Social History   Tobacco Use   Smoking status: Former    Types: Cigarettes    Quit date: 05/27/1999    Years since quitting: 23.3  Substance Use Topics   Alcohol use: Yes    Comment: occassionally   Drug use: No    Family Medical History: No family history on file.  Physical Examination: Vitals:   09/20/22 0825  BP: 115/78    General: Patient is in no apparent distress. Attention to examination is appropriate.  Neck:   Supple.  Full range of motion with discomfort on extension as well as rotation.  Respiratory: Patient is breathing without any difficulty.   NEUROLOGICAL:     Awake, alert, oriented to person, place, and time.  Speech is clear and fluent.   Cranial Nerves: Pupils equal round and reactive to light.  Facial tone is symmetric.  Facial sensation is symmetric. Shoulder shrug is symmetric. Tongue protrusion is midline.  There is no pronator drift.  Strength: Side Biceps Triceps Deltoid Interossei Grip Wrist Ext. Wrist Flex.  R 5 5 5 5 5 5 5   L 5 5 5 5 5 5 5    Side Iliopsoas Quads Hamstring PF DF EHL  R 5 5 5 5 5 5   L 5 5 5 5 5 5    Reflexes are 1+ and symmetric at the biceps, triceps, brachioradialis, patella and achilles.   Hoffman's is absent.   Bilateral upper and lower extremity sensation is intact to light touch.    No evidence of dysmetria noted.  Gait is normal.     Medical Decision Making  Imaging: MRI C spine 08/27/2022 IMPRESSION: 1. Slight progression of right foraminal stenosis at C2-3. 2. Progressive moderate right foraminal stenosis at C4-5. 3. Stable mild left foraminal narrowing at C4-5 and C5-6. 4. Moderate left and right foraminal stenosis at C3-4 is stable. 5. Mild left uncovertebral spurring at C6-7 without significant stenosis or change.     Electronically Signed   By: Marin Roberts M.D.   On: 08/27/2022 16:02  I have personally reviewed the images and  agree with the above interpretation.  Assessment and Plan: Paul Byrd is a pleasant 72 y.o. male with symptoms concerning for possible C4 and C5 radiculopathies.  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  He has substantial disc degenerative change at both levels.  I think this could be the cause of his pain.  He has tried and failed conservative management including physical therapy and injections.  At this point, no further conservative management is indicated.  I have recommended C3-5 anterior cervical discectomy and fusion.  I discussed the planned procedure at length with the patient, including the risks, benefits, alternatives, and indications. The risks discussed include but are not limited to bleeding, infection, need for reoperation, spinal fluid leak, stroke, vision loss, anesthetic complication, coma, paralysis, and even death. We also discussed the possibility of post-operative dysphagia, vocal cord paralysis, and the risk of  adjacent segment disease in the future. I also described in detail that improvement was not guaranteed.  The patient expressed understanding of these risks, and asked that we proceed with surgery. I described the surgery in layman's terms, and gave ample opportunity for questions, which were answered to the best of my ability.     Thank you for involving me in the care of this patient.      Macintyre Alexa K. Myer Haff MD, [facility]

## 2022-09-20 ENCOUNTER — Encounter: Payer: Self-pay | Admitting: Neurosurgery

## 2022-09-20 ENCOUNTER — Ambulatory Visit (INDEPENDENT_AMBULATORY_CARE_PROVIDER_SITE_OTHER): Payer: BC Managed Care – PPO | Admitting: Neurosurgery

## 2022-09-20 VITALS — BP 115/78 | Ht 72.0 in | Wt 284.2 lb

## 2022-09-20 DIAGNOSIS — M4802 Spinal stenosis, cervical region: Secondary | ICD-10-CM

## 2022-09-20 DIAGNOSIS — M5412 Radiculopathy, cervical region: Secondary | ICD-10-CM

## 2022-09-20 NOTE — Patient Instructions (Signed)
Please see below for information in regards to your upcoming surgery:   Planned surgery: C3-5 anterior cervical discectomy and fusion   Surgery date: 10/10/22 - you will find out your arrival time the business day before your surgery.   Pre-op appointment at [facility] Pre-admit Testing: we will call you with a date/time for this. Pre-admit testing is located on the first floor of the [facility] building, [address] [facility] [address]. Please bring all prescriptions in the original prescription bottles to your appointment, even if you have reviewed medications by phone with a pharmacy representative. Pre-op labs may be done at your pre-op appointment. You are not required to fast for these labs. Should you need to change your pre-op appointment, please call Pre-admit testing at [phone].    Surgical clearance: we will send a clearance form to Dr Judithann Sheen    NSAIDS (Non-steroidal anti-inflammatory drugs): because you are having a fusion, no NSAIDS (such as ibuprofen, aleve, naproxen, meloxicam, diclofenac) for 3 months after surgery. Celebrex is an exception. Tylenol is ok because it is not an NSAID.    Because you are having a fusion: for appointments after your 2 week follow-up: please arrive at the [facility] outpatient imaging center ([address], [place]) or [facility] one hour prior to your appointment for x-rays. This applies to every appointment after your 2 week follow-up. Failure to do so may result in your appointment being rescheduled.   If you have FMLA/disability paperwork, please drop it off or fax it to [phone], attention Patty.   How to contact us:  If you have any questions/concerns before or after surgery, you can reach [place] at [phone], or you can send a mychart message. We can be reached by phone or mychart 8am-4pm, Monday-Friday.  *Please note: Calls after 4pm are forwarded to a third party answering  service. Mychart messages are not routinely monitored during evenings, weekends, and holidays. Please call our office to contact the answering service for urgent concerns during non-business hours.    Appointments/FMLA & disability paperwork: Patty & Cristin  Nurse: Royston Cowper  Medical assistants: Laurann [place] Physician Assistant's: Manning Charity & Drake Leach Surgeon: Venetia Night, MD

## 2022-09-21 ENCOUNTER — Other Ambulatory Visit: Payer: Self-pay

## 2022-09-21 DIAGNOSIS — N529 Male erectile dysfunction, unspecified: Secondary | ICD-10-CM | POA: Insufficient documentation

## 2022-09-21 DIAGNOSIS — E785 Hyperlipidemia, unspecified: Secondary | ICD-10-CM | POA: Insufficient documentation

## 2022-09-21 DIAGNOSIS — Z8739 Personal history of other diseases of the musculoskeletal system and connective tissue: Secondary | ICD-10-CM | POA: Insufficient documentation

## 2022-09-21 DIAGNOSIS — Z85528 Personal history of other malignant neoplasm of kidney: Secondary | ICD-10-CM | POA: Insufficient documentation

## 2022-09-21 DIAGNOSIS — N400 Enlarged prostate without lower urinary tract symptoms: Secondary | ICD-10-CM | POA: Insufficient documentation

## 2022-09-21 DIAGNOSIS — Z01818 Encounter for other preprocedural examination: Secondary | ICD-10-CM

## 2022-09-28 ENCOUNTER — Encounter
Admission: RE | Admit: 2022-09-28 | Discharge: 2022-09-28 | Disposition: A | Discharge status: Home / Self Care | Payer: BC Managed Care – PPO | Source: Ambulatory Visit | Attending: Neurosurgery | Admitting: Neurosurgery

## 2022-09-28 DIAGNOSIS — Z6835 Body mass index (BMI) 35.0-35.9, adult: Secondary | ICD-10-CM | POA: Diagnosis not present

## 2022-09-28 DIAGNOSIS — E669 Obesity, unspecified: Secondary | ICD-10-CM | POA: Insufficient documentation

## 2022-09-28 DIAGNOSIS — Z85528 Personal history of other malignant neoplasm of kidney: Secondary | ICD-10-CM | POA: Insufficient documentation

## 2022-09-28 DIAGNOSIS — E785 Hyperlipidemia, unspecified: Secondary | ICD-10-CM | POA: Diagnosis not present

## 2022-09-28 DIAGNOSIS — Z01812 Encounter for preprocedural laboratory examination: Secondary | ICD-10-CM

## 2022-09-28 DIAGNOSIS — Z01818 Encounter for other preprocedural examination: Secondary | ICD-10-CM | POA: Insufficient documentation

## 2022-09-28 DIAGNOSIS — Z01810 Encounter for preprocedural cardiovascular examination: Secondary | ICD-10-CM | POA: Diagnosis not present

## 2022-09-28 HISTORY — DX: Post-traumatic stress disorder, unspecified: F43.10

## 2022-09-28 HISTORY — DX: Spinal stenosis, lumbar region with neurogenic claudication: M48.062

## 2022-09-28 HISTORY — DX: Other cervical disc degeneration, unspecified cervical region: M50.30

## 2022-09-28 HISTORY — DX: Male erectile dysfunction, unspecified: N52.9

## 2022-09-28 LAB — BASIC METABOLIC PANEL
Anion gap: 10 (ref 5–15)
BUN: 17 mg/dL (ref 8–23)
CO2: 20 mmol/L — ABNORMAL LOW (ref 22–32)
Calcium: 9.3 mg/dL (ref 8.9–10.3)
Chloride: 107 mmol/L (ref 98–111)
Creatinine, Ser: 1.09 mg/dL (ref 0.61–1.24)
GFR, Estimated: >60 mL/min (ref >60)
Glucose, Bld: 88 mg/dL (ref 70–99)
Potassium: 3.9 mmol/L (ref 3.5–5.1)
Sodium: 137 mmol/L (ref 135–145)

## 2022-09-28 LAB — SURGICAL PCR SCREEN
MRSA, PCR: NEGATIVE
Staphylococcus aureus: POSITIVE — AB

## 2022-09-28 LAB — TYPE AND SCREEN
ABO/RH(D): O POS
Antibody Screen: NEGATIVE

## 2022-09-28 LAB — CBC
HCT: 45.4 % (ref 39.0–52.0)
Hemoglobin: 15 g/dL (ref 13.0–17.0)
MCH: 28.8 pg (ref 26.0–34.0)
MCHC: 33 g/dL (ref 30.0–36.0)
MCV: 87.3 fL (ref 80.0–100.0)
Platelets: 195 10*3/uL (ref 150–400)
RBC: 5.2 MIL/uL (ref 4.22–5.81)
RDW: 14.1 % (ref 11.5–15.5)
WBC: 8.3 10*3/uL (ref 4.0–10.5)
nRBC: 0 % (ref 0.0–0.2)

## 2022-09-28 NOTE — Patient Instructions (Addendum)
Your procedure is scheduled on: Monday, July 1 Report to the Registration Desk on the 1st floor of the [facility] To find out your arrival time, please call [phone] between 1PM - 3PM on: Friday, June 28 If your arrival time is 6:00 am, do not arrive before that time as the [facility] entrance doors do not open until 6:00 am.  REMEMBER: Instructions that are not followed completely may result in serious medical risk, up to and including death; or upon the discretion of your surgeon and anesthesiologist your surgery may need to be rescheduled.  Do not eat food after midnight the night before surgery.  No gum chewing or hard candies.  You may however, drink CLEAR liquids up to 2 hours before you are scheduled to arrive for your surgery. Do not drink anything within 2 hours of your scheduled arrival time.  Clear liquids include: - water  - apple juice without pulp - gatorade (not RED colors) - black coffee or tea (Do NOT add milk or creamers to the coffee or tea) Do NOT drink anything that is not on this list.  One week prior to surgery: starting June 24 Stop ANY OVER THE COUNTER supplements until after surgery. Stop multiple vitamins. You may however, continue to take Tylenol if needed for pain up until the day of surgery.  Continue taking all prescribed medications with the exception of the following:  Sildenafil (Viagra) hold for 2 days before surgery.  TAKE ONLY THESE MEDICATIONS THE MORNING OF SURGERY WITH A SIP OF WATER:  Topiramate (Topamax) Venlafaxine (Effexor)  No Alcohol for 24 hours before or after surgery.  No Smoking including e-cigarettes for 24 hours before surgery.  No chewable tobacco products for at least 6 hours before surgery.  No nicotine patches on the day of surgery.  Do not use any "recreational" drugs for at least a week (preferably 2 weeks) before your surgery.  Please be advised that the combination of cocaine and anesthesia may have  negative outcomes, up to and including death. If you test positive for cocaine, your surgery will be cancelled.  On the morning of surgery brush your teeth with toothpaste and water, you may rinse your mouth with mouthwash if you wish. Do not swallow any toothpaste or mouthwash.  Use CHG Soap as directed on instruction sheet.  Do not wear jewelry.  Do not wear lotions, powders, or perfumes.   Do not shave body hair from the neck down 48 hours before surgery.  Contact lenses, hearing aids and dentures may not be worn into surgery.  Do not bring valuables to the hospital. [facility] is not responsible for any missing/lost belongings or valuables.   Notify your doctor if there is any change in your medical condition (cold, fever, infection).  Wear comfortable clothing (specific to your surgery type) to the hospital.  After surgery, you can help prevent lung complications by doing breathing exercises.  Take deep breaths and cough every 1-2 hours. Your doctor may order a device called an Incentive Spirometer to help you take deep breaths.  If you are being discharged the day of surgery, you will not be allowed to drive home. You will need a responsible individual to drive you home and stay with you for 24 hours after surgery.   If you are taking public transportation, you will need to have a responsible individual with you.  Please call the Pre-admissions Testing Dept. at [phone] if you have any questions about these instructions.  Surgery Visitation Policy:  Patients having surgery or a procedure may have two visitors.  Children under the age of 54 must have an adult with them who is not the patient.    Pre-operative 5 CHG Bath Instructions   You can play a key role in reducing the risk of infection after surgery. Your skin needs to be as free of germs as possible. You can reduce the number of germs on your skin by washing with CHG (chlorhexidine gluconate) soap before  surgery. CHG is an antiseptic soap that kills germs and continues to kill germs even after washing.   DO NOT use if you have an allergy to chlorhexidine/CHG or antibacterial soaps. If your skin becomes reddened or irritated, stop using the CHG and notify one of our RNs at [phone].   Please shower with the CHG soap starting 4 days before surgery using the following schedule:     Please keep in mind the following:  DO NOT shave, including legs and underarms, starting the day of your first shower.   You may shave your face at any point before/day of surgery.  Place clean sheets on your bed the day you start using CHG soap. Use a clean washcloth (not used since being washed) for each shower. DO NOT sleep with pets once you start using the CHG.   CHG Shower Instructions:  If you choose to wash your hair and private area, wash first with your normal shampoo/soap.  After you use shampoo/soap, rinse your hair and body thoroughly to remove shampoo/soap residue.  Turn the water OFF and apply about 3 tablespoons (45 ml) of CHG soap to a CLEAN washcloth.  Apply CHG soap ONLY FROM YOUR NECK DOWN TO YOUR TOES (washing for 3-5 minutes)  DO NOT use CHG soap on face, private areas, open wounds, or sores.  Pay special attention to the area where your surgery is being performed.  If you are having back surgery, having someone wash your back for you may be helpful. Wait 2 minutes after CHG soap is applied, then you may rinse off the CHG soap.  Pat dry with a clean towel  Put on clean clothes/pajamas   If you choose to wear lotion, please use ONLY the CHG-compatible lotions on the back of this paper.     Additional instructions for the day of surgery: DO NOT APPLY any lotions, deodorants, cologne, or perfumes.   Put on clean/comfortable clothes.  Brush your teeth.  Ask your nurse before applying any prescription medications to the skin.      CHG Compatible Lotions   Aveeno Moisturizing  lotion  Cetaphil Moisturizing Cream  Cetaphil Moisturizing Lotion  Clairol Herbal Essence Moisturizing Lotion, Dry Skin  Clairol Herbal Essence Moisturizing Lotion, Extra Dry Skin  Clairol Herbal Essence Moisturizing Lotion, Normal Skin  Curel Age Defying Therapeutic Moisturizing Lotion with Alpha Hydroxy  Curel Extreme Care Body Lotion  Curel Soothing Hands Moisturizing Hand Lotion  Curel Therapeutic Moisturizing Cream, Fragrance-Free  Curel Therapeutic Moisturizing Lotion, Fragrance-Free  Curel Therapeutic Moisturizing Lotion, Original Formula  Eucerin Daily Replenishing Lotion  Eucerin Dry Skin Therapy Plus Alpha Hydroxy Crme  Eucerin Dry Skin Therapy Plus Alpha Hydroxy Lotion  Eucerin Original Crme  Eucerin Original Lotion  Eucerin Plus Crme Eucerin Plus Lotion  Eucerin TriLipid Replenishing Lotion  Keri Anti-Bacterial Hand Lotion  Keri Deep Conditioning Original Lotion Dry Skin Formula Softly Scented  Keri Deep Conditioning Original Lotion, Fragrance Free Sensitive Skin Formula  Keri Lotion Fast Absorbing Fragrance  Free Sensitive Skin Formula  Keri Lotion Fast Absorbing Softly Scented Dry Skin Formula  Keri Original Lotion  Keri Skin Renewal Lotion Keri Silky Smooth Lotion  Keri Silky Smooth Sensitive Skin Lotion  Nivea Body Creamy Conditioning Oil  Nivea Body Extra Enriched Teacher, adult education Moisturizing Lotion Nivea Crme  Nivea Skin Firming Lotion  NutraDerm 30 Skin Lotion  NutraDerm Skin Lotion  NutraDerm Therapeutic Skin Cream  NutraDerm Therapeutic Skin Lotion  ProShield Protective Hand Cream  Provon moisturizing lotion

## 2022-10-10 ENCOUNTER — Ambulatory Visit
Admission: RE | Admit: 2022-10-10 | Discharge: 2022-10-10 | Disposition: A | Discharge status: Home / Self Care | Payer: BC Managed Care – PPO | Attending: Neurosurgery | Admitting: Neurosurgery

## 2022-10-10 ENCOUNTER — Ambulatory Visit: Payer: BC Managed Care – PPO

## 2022-10-10 ENCOUNTER — Other Ambulatory Visit: Payer: Self-pay

## 2022-10-10 ENCOUNTER — Encounter: Payer: Self-pay | Admitting: Neurosurgery

## 2022-10-10 ENCOUNTER — Ambulatory Visit: Payer: BC Managed Care – PPO | Admitting: Urgent Care

## 2022-10-10 ENCOUNTER — Encounter
Admission: RE | Disposition: A | Discharge status: Home / Self Care | Payer: Self-pay | Source: Home / Self Care | Attending: Neurosurgery

## 2022-10-10 DIAGNOSIS — M109 Gout, unspecified: Secondary | ICD-10-CM | POA: Insufficient documentation

## 2022-10-10 DIAGNOSIS — Z01818 Encounter for other preprocedural examination: Secondary | ICD-10-CM

## 2022-10-10 DIAGNOSIS — E669 Obesity, unspecified: Secondary | ICD-10-CM | POA: Insufficient documentation

## 2022-10-10 DIAGNOSIS — Z87891 Personal history of nicotine dependence: Secondary | ICD-10-CM | POA: Insufficient documentation

## 2022-10-10 DIAGNOSIS — Z85528 Personal history of other malignant neoplasm of kidney: Secondary | ICD-10-CM | POA: Diagnosis not present

## 2022-10-10 DIAGNOSIS — Z6837 Body mass index (BMI) 37.0-37.9, adult: Secondary | ICD-10-CM | POA: Diagnosis not present

## 2022-10-10 DIAGNOSIS — M1990 Unspecified osteoarthritis, unspecified site: Secondary | ICD-10-CM | POA: Insufficient documentation

## 2022-10-10 DIAGNOSIS — N400 Enlarged prostate without lower urinary tract symptoms: Secondary | ICD-10-CM | POA: Insufficient documentation

## 2022-10-10 DIAGNOSIS — F4310 Post-traumatic stress disorder, unspecified: Secondary | ICD-10-CM | POA: Insufficient documentation

## 2022-10-10 DIAGNOSIS — Z905 Acquired absence of kidney: Secondary | ICD-10-CM | POA: Insufficient documentation

## 2022-10-10 DIAGNOSIS — M2578 Osteophyte, vertebrae: Secondary | ICD-10-CM | POA: Diagnosis not present

## 2022-10-10 DIAGNOSIS — R519 Headache, unspecified: Secondary | ICD-10-CM | POA: Insufficient documentation

## 2022-10-10 DIAGNOSIS — Z01812 Encounter for preprocedural laboratory examination: Secondary | ICD-10-CM

## 2022-10-10 DIAGNOSIS — M4802 Spinal stenosis, cervical region: Secondary | ICD-10-CM | POA: Diagnosis not present

## 2022-10-10 DIAGNOSIS — M5412 Radiculopathy, cervical region: Secondary | ICD-10-CM | POA: Diagnosis not present

## 2022-10-10 DIAGNOSIS — E785 Hyperlipidemia, unspecified: Secondary | ICD-10-CM

## 2022-10-10 HISTORY — PX: ANTERIOR CERVICAL DECOMP/DISCECTOMY FUSION: SHX1161

## 2022-10-10 SURGERY — ANTERIOR CERVICAL DECOMPRESSION/DISCECTOMY FUSION 2 LEVELS
Anesthesia: General | Site: Spine Cervical

## 2022-10-10 MED ORDER — METHOCARBAMOL 750 MG PO TABS: 750.0000 mg | ORAL_TABLET | Freq: Four times a day (QID) | ORAL | 0 refills | Status: DC

## 2022-10-10 MED ORDER — ORAL CARE MOUTH RINSE: 15.0000 mL | Freq: Once | OROMUCOSAL | Status: AC

## 2022-10-10 MED ORDER — 0.9 % SODIUM CHLORIDE (POUR BTL) OPTIME
TOPICAL | Status: DC | PRN
  Administered 2022-10-10: 500 mL

## 2022-10-10 MED ORDER — CHLORHEXIDINE GLUCONATE 0.12 % MT SOLN
15.0000 mL | Freq: Once | OROMUCOSAL | Status: AC
  Administered 2022-10-10: 15 mL via OROMUCOSAL

## 2022-10-10 MED ORDER — ONDANSETRON HCL 4 MG/2ML IJ SOLN: 4.0000 mg | Freq: Once | INTRAMUSCULAR | Status: DC | PRN

## 2022-10-10 MED ORDER — FENTANYL CITRATE (PF) 100 MCG/2ML IJ SOLN
INTRAMUSCULAR | Status: AC
  Filled 2022-10-10: qty 2

## 2022-10-10 MED ORDER — ACETAMINOPHEN 10 MG/ML IV SOLN: 1000.0000 mg | Freq: Once | INTRAVENOUS | Status: DC | PRN

## 2022-10-10 MED ORDER — METHOCARBAMOL 750 MG PO TABS
ORAL_TABLET | ORAL | Status: AC
  Filled 2022-10-10: qty 1

## 2022-10-10 MED ORDER — ACETAMINOPHEN 10 MG/ML IV SOLN
INTRAVENOUS | Status: AC
  Filled 2022-10-10: qty 100

## 2022-10-10 MED ORDER — LACTATED RINGERS IV SOLN: INTRAVENOUS | Status: DC

## 2022-10-10 MED ORDER — LIDOCAINE HCL (PF) 2 % IJ SOLN
INTRAMUSCULAR | Status: AC
  Filled 2022-10-10: qty 5

## 2022-10-10 MED ORDER — PROPOFOL 10 MG/ML IV BOLUS
INTRAVENOUS | Status: DC | PRN
  Administered 2022-10-10: 150 mg via INTRAVENOUS

## 2022-10-10 MED ORDER — PROPOFOL 1000 MG/100ML IV EMUL
INTRAVENOUS | Status: AC
  Filled 2022-10-10: qty 100

## 2022-10-10 MED ORDER — METHOCARBAMOL 500 MG PO TABS
750.0000 mg | ORAL_TABLET | Freq: Once | ORAL | Status: AC
  Administered 2022-10-10: 750 mg via ORAL

## 2022-10-10 MED ORDER — EPHEDRINE SULFATE (PRESSORS) 50 MG/ML IJ SOLN
INTRAMUSCULAR | Status: DC | PRN
  Administered 2022-10-10: 10 mg via INTRAVENOUS
  Administered 2022-10-10 (×5): 5 mg via INTRAVENOUS

## 2022-10-10 MED ORDER — OXYCODONE HCL 5 MG PO TABS: 5.0000 mg | ORAL_TABLET | ORAL | 0 refills | Status: AC | PRN

## 2022-10-10 MED ORDER — BUPIVACAINE-EPINEPHRINE (PF) 0.5% -1:200000 IJ SOLN
INTRAMUSCULAR | Status: DC | PRN
  Administered 2022-10-10: 6 mL via PERINEURAL

## 2022-10-10 MED ORDER — CEFAZOLIN SODIUM-DEXTROSE 2-4 GM/100ML-% IV SOLN
2.0000 g | Freq: Once | INTRAVENOUS | Status: AC
  Administered 2022-10-10: 2 g via INTRAVENOUS

## 2022-10-10 MED ORDER — EPHEDRINE 5 MG/ML INJ
INTRAVENOUS | Status: AC
  Filled 2022-10-10: qty 5

## 2022-10-10 MED ORDER — PROPOFOL 10 MG/ML IV BOLUS
INTRAVENOUS | Status: AC
  Filled 2022-10-10: qty 20

## 2022-10-10 MED ORDER — SUCCINYLCHOLINE CHLORIDE 200 MG/10ML IV SOSY
PREFILLED_SYRINGE | INTRAVENOUS | Status: AC
  Filled 2022-10-10: qty 10

## 2022-10-10 MED ORDER — SURGIFLO WITH THROMBIN (HEMOSTATIC MATRIX KIT) OPTIME
TOPICAL | Status: DC | PRN
  Administered 2022-10-10: 1 via TOPICAL

## 2022-10-10 MED ORDER — SENNA 8.6 MG PO TABS: 1.0000 | ORAL_TABLET | Freq: Every day | ORAL | 0 refills | Status: DC | PRN

## 2022-10-10 MED ORDER — DEXAMETHASONE SODIUM PHOSPHATE 10 MG/ML IJ SOLN
INTRAMUSCULAR | Status: DC | PRN
  Administered 2022-10-10: 10 mg via INTRAVENOUS

## 2022-10-10 MED ORDER — FAMOTIDINE 20 MG PO TABS
20.0000 mg | ORAL_TABLET | Freq: Once | ORAL | Status: AC
  Administered 2022-10-10: 20 mg via ORAL

## 2022-10-10 MED ORDER — OXYCODONE HCL 5 MG PO TABS
5.0000 mg | ORAL_TABLET | Freq: Once | ORAL | Status: AC | PRN
  Administered 2022-10-10: 5 mg via ORAL

## 2022-10-10 MED ORDER — PHENYLEPHRINE HCL-NACL 20-0.9 MG/250ML-% IV SOLN
INTRAVENOUS | Status: AC
  Filled 2022-10-10: qty 250

## 2022-10-10 MED ORDER — PROPOFOL 500 MG/50ML IV EMUL
INTRAVENOUS | Status: DC | PRN
  Administered 2022-10-10: 100 ug/kg/min via INTRAVENOUS

## 2022-10-10 MED ORDER — PHENYLEPHRINE HCL-NACL 20-0.9 MG/250ML-% IV SOLN
INTRAVENOUS | Status: DC | PRN
  Administered 2022-10-10: 30 ug/min via INTRAVENOUS

## 2022-10-10 MED ORDER — MIDAZOLAM HCL 2 MG/2ML IJ SOLN
INTRAMUSCULAR | Status: DC | PRN
  Administered 2022-10-10: 1 mg via INTRAVENOUS

## 2022-10-10 MED ORDER — CEFAZOLIN SODIUM-DEXTROSE 2-4 GM/100ML-% IV SOLN
INTRAVENOUS | Status: AC
  Filled 2022-10-10: qty 100

## 2022-10-10 MED ORDER — FAMOTIDINE 20 MG PO TABS
ORAL_TABLET | ORAL | Status: AC
  Filled 2022-10-10: qty 1

## 2022-10-10 MED ORDER — ACETAMINOPHEN 10 MG/ML IV SOLN
INTRAVENOUS | Status: DC | PRN
  Administered 2022-10-10: 1000 mg via INTRAVENOUS

## 2022-10-10 MED ORDER — REMIFENTANIL HCL 1 MG IV SOLR
INTRAVENOUS | Status: AC
  Filled 2022-10-10: qty 1000

## 2022-10-10 MED ORDER — SUCCINYLCHOLINE CHLORIDE 200 MG/10ML IV SOSY
PREFILLED_SYRINGE | INTRAVENOUS | Status: DC | PRN
  Administered 2022-10-10: 120 mg via INTRAVENOUS

## 2022-10-10 MED ORDER — CEFAZOLIN IN SODIUM CHLORIDE 2-0.9 GM/100ML-% IV SOLN
3.0000 g | Freq: Once | INTRAVENOUS | Status: DC
  Filled 2022-10-10: qty 200

## 2022-10-10 MED ORDER — ONDANSETRON HCL 4 MG/2ML IJ SOLN
INTRAMUSCULAR | Status: DC | PRN
  Administered 2022-10-10: 4 mg via INTRAVENOUS

## 2022-10-10 MED ORDER — FENTANYL CITRATE (PF) 100 MCG/2ML IJ SOLN
25.0000 ug | INTRAMUSCULAR | Status: DC | PRN
  Administered 2022-10-10 (×3): 25 ug via INTRAVENOUS

## 2022-10-10 MED ORDER — OXYCODONE HCL 5 MG PO TABS
ORAL_TABLET | ORAL | Status: AC
  Filled 2022-10-10: qty 1

## 2022-10-10 MED ORDER — OXYCODONE HCL 5 MG/5ML PO SOLN: 5.0000 mg | Freq: Once | ORAL | Status: AC | PRN

## 2022-10-10 MED ORDER — MIDAZOLAM HCL 2 MG/2ML IJ SOLN
INTRAMUSCULAR | Status: AC
  Filled 2022-10-10: qty 2

## 2022-10-10 MED ORDER — CHLORHEXIDINE GLUCONATE 0.12 % MT SOLN
OROMUCOSAL | Status: AC
  Filled 2022-10-10: qty 15

## 2022-10-10 MED ORDER — ONDANSETRON HCL 4 MG/2ML IJ SOLN
INTRAMUSCULAR | Status: AC
  Filled 2022-10-10: qty 2

## 2022-10-10 MED ORDER — LIDOCAINE HCL (CARDIAC) PF 100 MG/5ML IV SOSY
PREFILLED_SYRINGE | INTRAVENOUS | Status: DC | PRN
  Administered 2022-10-10: 100 mg via INTRAVENOUS

## 2022-10-10 MED ORDER — DEXAMETHASONE SODIUM PHOSPHATE 10 MG/ML IJ SOLN
INTRAMUSCULAR | Status: AC
  Filled 2022-10-10: qty 1

## 2022-10-10 MED ORDER — FENTANYL CITRATE (PF) 100 MCG/2ML IJ SOLN
INTRAMUSCULAR | Status: DC | PRN
  Administered 2022-10-10 (×2): 25 ug via INTRAVENOUS

## 2022-10-10 MED ORDER — CEFAZOLIN SODIUM-DEXTROSE 1-4 GM/50ML-% IV SOLN
INTRAVENOUS | Status: AC
  Filled 2022-10-10: qty 50

## 2022-10-10 MED ORDER — CEFAZOLIN SODIUM-DEXTROSE 1-4 GM/50ML-% IV SOLN
1.0000 g | Freq: Once | INTRAVENOUS | Status: AC
  Administered 2022-10-10: 1 g via INTRAVENOUS

## 2022-10-10 MED ORDER — REMIFENTANIL HCL 1 MG IV SOLR
INTRAVENOUS | Status: DC | PRN
  Administered 2022-10-10: .05 ug/kg/min via INTRAVENOUS

## 2022-10-10 MED ORDER — GLYCOPYRROLATE 0.2 MG/ML IJ SOLN
INTRAMUSCULAR | Status: DC | PRN
  Administered 2022-10-10: .2 mg via INTRAVENOUS

## 2022-10-10 SURGICAL SUPPLY — 48 items
ADH SKN CLS APL DERMABOND .7 (GAUZE/BANDAGES/DRESSINGS) ×1
AGENT HMST KT MTR STRL THRMB (HEMOSTASIS) ×1
ALLOGRAFT BONE FIBER KORE 1CC (Bone Implant) IMPLANT
BASIN KIT SINGLE STR (MISCELLANEOUS) ×1 IMPLANT
BULB RESERV EVAC DRAIN JP 100C (MISCELLANEOUS) IMPLANT
BUR NEURO DRILL SOFT 3.0X3.8M (BURR) ×1 IMPLANT
DERMABOND ADVANCED .7 DNX12 (GAUZE/BANDAGES/DRESSINGS) ×1 IMPLANT
DRAIN CHANNEL JP 10F RND 20C F (MISCELLANEOUS) IMPLANT
DRAPE C ARM PK CFD 31 SPINE (DRAPES) ×1 IMPLANT
DRAPE LAPAROTOMY 77X122 PED (DRAPES) ×1 IMPLANT
DRAPE MICROSCOPE SPINE 48X150 (DRAPES) ×1 IMPLANT
ELECT REM PT RETURN 9FT ADLT (ELECTROSURGICAL) ×1
ELECTRODE REM PT RTRN 9FT ADLT (ELECTROSURGICAL) ×1 IMPLANT
FEE INTRAOP CADWELL SUPPLY NCS (MISCELLANEOUS) IMPLANT
FEE INTRAOP MONITOR IMPULS NCS (MISCELLANEOUS) IMPLANT
GLOVE BIOGEL PI IND STRL 6.5 (GLOVE) ×1 IMPLANT
GLOVE SURG SYN 6.5 ES PF (GLOVE) ×1 IMPLANT
GLOVE SURG SYN 6.5 PF PI (GLOVE) ×1 IMPLANT
GLOVE SURG SYN 8.5 E (GLOVE) ×3 IMPLANT
GLOVE SURG SYN 8.5 PF PI (GLOVE) ×3 IMPLANT
GOWN SRG LRG LVL 4 IMPRV REINF (GOWNS) ×1 IMPLANT
GOWN SRG XL LVL 3 NONREINFORCE (GOWNS) ×1 IMPLANT
GOWN STRL NON-REIN TWL XL LVL3 (GOWNS) ×1
GOWN STRL REIN LRG LVL4 (GOWNS) ×1
INTRAOP CADWELL SUPPLY FEE NCS (MISCELLANEOUS)
INTRAOP DISP SUPPLY FEE NCS (MISCELLANEOUS)
INTRAOP MONITOR FEE IMPULS NCS (MISCELLANEOUS)
KIT TURNOVER KIT A (KITS) ×1 IMPLANT
MANIFOLD NEPTUNE II (INSTRUMENTS) ×1 IMPLANT
NDL SAFETY ECLIP 18X1.5 (MISCELLANEOUS) ×1 IMPLANT
NS IRRIG 1000ML POUR BTL (IV SOLUTION) ×1 IMPLANT
PACK LAMINECTOMY ARMC (PACKS) ×1 IMPLANT
PAD ARMBOARD 7.5X6 YLW CONV (MISCELLANEOUS) ×2 IMPLANT
PIN CASPAR 14 (PIN) ×1 IMPLANT
PIN CASPAR 14MM (PIN) ×2
PLATE ACP 1.6X36 2LVL (Plate) IMPLANT
SCREW ACP VSD 3.5X19 (Screw) IMPLANT
SPACER C HEDRON 12X14 7M 7D (Spacer) IMPLANT
SPONGE KITTNER 5P (MISCELLANEOUS) ×1 IMPLANT
STAPLER SKIN PROX 35W (STAPLE) IMPLANT
SURGIFLO W/THROMBIN 8M KIT (HEMOSTASIS) ×1 IMPLANT
SUT DVC VLOC 3-0 CL 6 P-12 (SUTURE) IMPLANT
SUT VIC AB 3-0 SH 8-18 (SUTURE) ×1 IMPLANT
SYR 20ML LL LF (SYRINGE) ×1 IMPLANT
TAPE CLOTH 3X10 WHT NS LF (GAUZE/BANDAGES/DRESSINGS) ×3 IMPLANT
TRAP FLUID SMOKE EVACUATOR (MISCELLANEOUS) ×1 IMPLANT
TRAY FOLEY SLVR 16FR LF STAT (SET/KITS/TRAYS/PACK) IMPLANT
WATER STERILE IRR 1000ML POUR (IV SOLUTION) ×2 IMPLANT

## 2022-10-10 NOTE — Transfer of Care (Signed)
Immediate Anesthesia Transfer of Care Note  Patient: Paul Byrd  Procedure(s) Performed: C3-5 ANTERIOR CERVICAL DISCECTOMY AND FUSION (HEDRON) (Spine Cervical)  Patient Location: PACU  Anesthesia Type:General  Level of Consciousness: drowsy and patient cooperative  Airway & Oxygen Therapy: Patient Spontanous Breathing and Patient connected to face mask oxygen  Post-op Assessment: Report given to RN and Post -op Vital signs reviewed and stable  Post vital signs: Reviewed and stable  Last Vitals:  Vitals Value Taken Time  BP 113/67 10/10/22 1220  Temp 36.3 C 10/10/22 1220  Pulse 79 10/10/22 1222  Resp 12 10/10/22 1222  SpO2 96 % 10/10/22 1222  Vitals shown include unvalidated device data.  Last Pain:  Vitals:   10/10/22 0831  TempSrc: Temporal  PainSc: 6          Complications: No notable events documented.

## 2022-10-10 NOTE — Interval H&P Note (Signed)
History and Physical Interval Note:  10/10/2022 9:36 AM  Paul Byrd  has presented today for surgery, with the diagnosis of M54.12 Cervical radiculopathy M48.02 Cervical stenosis.  The various methods of treatment have been discussed with the patient and family. After consideration of risks, benefits and other options for treatment, the patient has consented to  Procedure(s): C3-5 ANTERIOR CERVICAL DISCECTOMY AND FUSION (HEDRON) (N/A) as a surgical intervention.  The patient's history has been reviewed, patient examined, no change in status, stable for surgery.  I have reviewed the patient's chart and labs.  Questions were answered to the patient's satisfaction.    Heart sounds normal no MRG. Chest Clear to Auscultation Bilaterally.  Shirlie Enck

## 2022-10-10 NOTE — Op Note (Addendum)
Indications: Mr. Zaydn Gutridge is a 72 y.o. male with M54.12 Cervical radiculopathy, M48.02 Cervical stenosis . Due to ongoing symptoms and lack of reasonable conservative management options, the patient opted for surgical intervention.  Findings: stenosis, successful decompression  Preoperative Diagnosis: M54.12 Cervical radiculopathy, M48.02 Cervical stenosis  Postoperative Diagnosis: same   EBL: 25 ml IVF: see anesthesia record Drains: none Disposition: Extubated and Stable to PACU Complications: none  No foley catheter was placed.   Preoperative Note:     Preoperative Note:   Risks of surgery discussed and documented in clinic note.  Operative Note:   Procedure:  1) Anterior cervical diskectomy and fusion at C3/4 and C4/5 2) Anterior cervical instrumentation at C3 - 5 3) Placement of biomechanical devices at C3/4 and C4/5  4) Use of operative microscope 5) Use of flouroscopy   Procedure: After obtaining informed consent, the patient taken to the operating room, placed in supine position, general anesthesia induced.  The patient had a small shoulder roll placed behind their shoulders.  The patient received preop antibiotics and IV Decadron.  The patient had a neck incision outlined, was prepped and draped in usual sterile fashion. The incision was injected with local anesthetic.   An incision was opened, dissection taken down medial to the carotid artery and jugular vein, lateral to the trachea and esophagus.  The prevertebral fascia identified and a localizing x-ray demonstrated the correct level.  The longus colli were dissected laterally, and self-retaining retractors placed to open the operative field. The microscope was then brought into the field.  With this complete, distractor pins were placed in the vertebral bodies of C3 and C5. The distractor was placed, and the annuli at C3/4 and C4/5 were opened using a bovie.  Curettes and pituitary rongeurs used to remove the  majority of C3/4 disk, then the drill was used to remove the posterior osteophyte and begin the foraminotomies. The nerve hook was used to elevate the posterior longitudinal ligament, which was then removed with Kerrison rongeurs to complete decompression of the spinal cord. The Kerrison rongeurs were then used to complete the foraminotomies bilaterally to decompress the nerve roots. The nerve hook could be passed out each foramen, ensuring decompression of the nerve roots. Meticulous hemostasis was obtained. A biomechanical device (Globus Hedron C 7 mm height x 14 mm width by 12 mm depth) was placed at C3/4. The device had been filled with demineralized bone matrix for aid in arthrodesis.  Please note that the procedure included removal of the disc, removal of the posterior osteophytes, and removal of the posterior longitudinal ligament to ensure decompression of the spinal cord.  Additionally, foraminotomies were performed on both sides of the spinal canal to decompress the nerve roots.  We then moved to the C4/5 level. Curettes and pituitary rongeurs used to remove the majority of the disk, then the drill was used to remove the posterior osteophyte and begin the foraminotomies. The nerve hook was used to elevate the posterior longitudinal ligament, which was then removed with Kerrison rongeurs to complete decompression of the spinal cord. The Kerrison rongeurs were then used to complete the foraminotomies bilaterally to decompress the nerve roots. The nerve hook could be passed out each foramen, ensuring decompression of the nerve roots. Meticulous hemostasis was obtained. A biomechanical device (Globus Hedron C 7 mm height x 14 mm width by 12 mm depth) was placed at C4/5. The device had been filled with demineralized bone matrix for aid in arthrodesis.  Please note that  the procedure included removal of the disc, removal of the posterior osteophytes, and removal of the posterior longitudinal ligament to  ensure decompression of the spinal cord.  Additionally, foraminotomies were performed on both sides of the spinal canal to decompress the nerve roots.  The caspar distractor was removed, and bone wax used for hemostasis. A separate, 36 mm Nuvasive ACP plate was chosen (3 segment, 2 level plate). Two screws placed in each vertebral body, respectively making sure the screws were behind the locking mechanism.  Final AP and lateral radiographs were taken.   Please note that the plate is not inclusive to the biomechanical devices.  The anchoring mechanism of the plate is completely separate from the biomechanical devices.  With everything in good position, the wound was irrigated copiously and meticulous hemostasis obtained.  Wound was closed in 2 layers using interrupted inverted 3-0 Vicryl sutures in the platysma and 3-0 monocryl on the dermis.  The wound was dressed with dermabond, the head of bed at 30 degrees, taken to recovery room in stable condition.  No new postop neurological deficits were identified.  Sponge and pattie counts were correct at the end of the procedure.     I performed the entire procedure with the assistance of Manning Charity PA as an Designer, television/film set. An assistant was required for this procedure due to the complexity.  The assistant provided assistance in tissue manipulation and suction, and was required for the successful and safe performance of the procedure. I performed the critical portions of the procedure.   Venetia Night MD

## 2022-10-10 NOTE — Progress Notes (Signed)
Patient awake/alert x4. Patient able to ambulate from bathroom, voided without event. Tolerated soup for lunch without event. Reviewed discharge instructions with patient and wife. All questions answered, patient verbalizes understanding of post op instructions.

## 2022-10-10 NOTE — Anesthesia Preprocedure Evaluation (Addendum)
Anesthesia Evaluation  Patient identified by MRN, date of birth, ID band Patient awake    Reviewed: Allergy & Precautions, NPO status , Patient's Chart, lab work & pertinent test results  History of Anesthesia Complications Negative for: history of anesthetic complications  Airway Mallampati: II   Neck ROM: Full    Dental  (+) Missing   Pulmonary former smoker (quit 2001)   Pulmonary exam normal breath sounds clear to auscultation       Cardiovascular Normal cardiovascular exam Rhythm:Regular Rate:Normal  ECG 09/28/22:  Normal sinus rhythm Nonspecific T wave abnormality Abnormal ECG When compared with ECG of 07-Aug-2012 09:14, No significant change was found   Neuro/Psych  Headaches PSYCHIATRIC DISORDERS (PTSD)         GI/Hepatic negative GI ROS,,,  Endo/Other  Obesity   Renal/GU Renal disease (kidney CA s/p left nephrectomy 2001)   BPH    Musculoskeletal  (+) Arthritis ,  Gout    Abdominal   Peds  Hematology negative hematology ROS (+)   Anesthesia Other Findings   Reproductive/Obstetrics                             Anesthesia Physical Anesthesia Plan  ASA: 2  Anesthesia Plan: General   Post-op Pain Management:    Induction: Intravenous  PONV Risk Score and Plan: 2 and Ondansetron, Dexamethasone and Treatment may vary due to age or medical condition  Airway Management Planned: Oral ETT  Additional Equipment:   Intra-op Plan:   Post-operative Plan: Extubation in OR  Informed Consent: I have reviewed the patients History and Physical, chart, labs and discussed the procedure including the risks, benefits and alternatives for the proposed anesthesia with the patient or authorized representative who has indicated his/her understanding and acceptance.     Dental advisory given  Plan Discussed with: CRNA  Anesthesia Plan Comments: (Patient consented for risks of  anesthesia including but not limited to:  - adverse reactions to medications - damage to eyes, teeth, lips or other oral mucosa - nerve damage due to positioning  - sore throat or hoarseness - damage to heart, brain, nerves, lungs, other parts of body or loss of life  Informed patient about role of CRNA in peri- and intra-operative care.  Patient voiced understanding.)        Anesthesia Quick Evaluation

## 2022-10-10 NOTE — Anesthesia Procedure Notes (Signed)
Procedure Name: Intubation Date/Time: 10/10/2022 10:25 AM  Performed by: Paul Byrd, MDPre-anesthesia Checklist: Patient identified, Emergency Drugs available, Suction available and Patient being monitored Patient Re-evaluated:Patient Re-evaluated prior to induction Oxygen Delivery Method: Circle system utilized Preoxygenation: Pre-oxygenation with 100% oxygen Induction Type: IV induction Ventilation: Mask ventilation without difficulty, Two handed mask ventilation required and Oral airway inserted - appropriate to patient size Laryngoscope Size: McGraph and 4 Grade View: Grade I Tube type: Oral Tube size: 8.0 mm Number of attempts: 1 Airway Equipment and Method: Stylet and Oral airway Placement Confirmation: ETT inserted through vocal cords under direct vision, positive ETCO2 and breath sounds checked- equal and bilateral Secured at: 23 cm Tube secured with: Tape Dental Injury: Teeth and Oropharynx as per pre-operative assessment  Comments: Atraumatic intubation. Kebra Lowrimore, SRNA placed ETT under direct supervision. MDA and CRNA at bedside.

## 2022-10-10 NOTE — Discharge Summary (Signed)
Discharge Summary  Patient ID: Paul Byrd MRN: [id number] DOB/AGE: April 15, 1950 72 y.o.  Admit date: 10/10/2022 Discharge date: 10/10/2022  Admission Diagnoses:  M54.12 Cervical radiculopathy M48.02 Cervical stenosis. Discharge Diagnoses:  Active Problems:   * No active hospital problems. *   Discharged Condition: good  Hospital Course:  Paul Byrd is a 72 year old presenting with cervical radiculopathy status post C3-5 ACDF.  His intraoperative course was uncomplicated.  He was monitored in PACU for 4 hours and discharged home after ambulating, urinating, and tolerating p.o. intake.  He was given prescriptions for pain medication, muscle relaxant, and stool softener.  Consults: None  Significant Diagnostic Studies: none  Treatments: surgery: As above.  Please see separately dictated operative report for further details  Discharge Exam: Blood pressure 108/75, pulse 66, temperature (!) 97.2 F (36.2 C), temperature source Temporal, resp. rate 18, height 6' (1.829 m), weight 126.1 kg, SpO2 94 %. A&O 5/5 throughout Incision c/d/I with dermabond in place  Disposition: Discharge disposition: 01-Home or Self Care        Allergies as of 10/10/2022       Reactions   Ibuprofen Other (See Comments)   Due to patient having one kidney        Medication List     TAKE these medications    acetaminophen 500 MG tablet Commonly known as: TYLENOL Take 1,000 mg by mouth every 6 (six) hours as needed for headache.   allopurinol 300 MG tablet Commonly known as: ZYLOPRIM Take 300 mg by mouth daily with breakfast.   gabapentin 300 MG capsule Commonly known as: NEURONTIN Take 600 mg by mouth at bedtime.   IRON (FERROUS SULFATE) PO Take 1 tablet by mouth at bedtime.   methocarbamol 750 MG tablet Commonly known as: Robaxin-750 Take 1 tablet (750 mg total) by mouth 4 (four) times daily.   Multi-Vitamin tablet Take 1 tablet by mouth daily.   oxyCODONE 5 MG  immediate release tablet Commonly known as: Roxicodone Take 1 tablet (5 mg total) by mouth every 4 (four) hours as needed for severe pain. What changed:  when to take this reasons to take this   senna 8.6 MG Tabs tablet Commonly known as: SENOKOT Take 1 tablet (8.6 mg total) by mouth daily as needed.   sildenafil 100 MG tablet Commonly known as: VIAGRA Take 100 mg by mouth daily as needed for erectile dysfunction.   tamsulosin 0.4 MG Caps capsule Commonly known as: FLOMAX Take 0.4 mg by mouth at bedtime.   topiramate 50 MG tablet Commonly known as: TOPAMAX Take 50 mg by mouth 2 (two) times daily.   venlafaxine XR 150 MG 24 hr capsule Commonly known as: EFFEXOR-XR Take 150 mg by mouth daily with breakfast.         Signed: Susanne Borders 10/10/2022, 12:09 PM

## 2022-10-10 NOTE — Interval H&P Note (Signed)
History and Physical Interval Note:  10/10/2022 9:35 AM  Paul Byrd  has presented today for surgery, with the diagnosis of M54.12 Cervical radiculopathy M48.02 Cervical stenosis.  The various methods of treatment have been discussed with the patient and family. After consideration of risks, benefits and other options for treatment, the patient has consented to  Procedure(s): C3-5 ANTERIOR CERVICAL DISCECTOMY AND FUSION (HEDRON) (N/A) as a surgical intervention.  The patient's history has been reviewed, patient examined, no change in status, stable for surgery.  I have reviewed the patient's chart and labs.  Questions were answered to the patient's satisfaction.    Heart sounds normal no MRG. Chest Clear to Auscultation Bilaterally.   Paul Byrd

## 2022-10-10 NOTE — Anesthesia Postprocedure Evaluation (Signed)
Anesthesia Post Note  Patient: Paul Byrd  Procedure(s) Performed: C3-5 ANTERIOR CERVICAL DISCECTOMY AND FUSION (HEDRON) (Spine Cervical)  Patient location during evaluation: PACU Anesthesia Type: General Level of consciousness: awake and alert, oriented and patient cooperative Pain management: pain level controlled Vital Signs Assessment: post-procedure vital signs reviewed and stable Respiratory status: spontaneous breathing, nonlabored ventilation and respiratory function stable Cardiovascular status: blood pressure returned to baseline and stable Postop Assessment: adequate PO intake Anesthetic complications: no   No notable events documented.   Last Vitals:  Vitals:   10/10/22 1315 10/10/22 1330  BP: 106/67 119/73  Pulse: 63 62  Resp: 12 18  Temp:    SpO2: 93% 95%    Last Pain:  Vitals:   10/10/22 1315  TempSrc:   PainSc: 3                  Reed Breech

## 2022-10-10 NOTE — Discharge Instructions (Addendum)
Your surgeon has performed an operation on your cervical spine (neck) to relieve pressure on the spinal cord and/or nerves. This involved making an incision in the front of your neck and removing one or more of the discs that support your spine. Next, a small piece of bone, a titanium plate, and screws were used to fuse two or more of the vertebrae (bones) together.  The following are instructions to help in your recovery once you have been discharged from the hospital. Even if you feel well, it is important that you follow these activity guidelines. If you do not let your neck heal properly from the surgery, you can increase the chance of return of your symptoms and other complications.  * Do not take anti-inflammatory medications for 3 months after surgery (naproxen [Aleve], ibuprofen [Advil, Motrin], etc.). These medications can prevent your bones from healing properly.  Celebrex, if prescribed, is ok to take.  Activity    No bending, lifting, or twisting ("BLT"). Avoid lifting objects heavier than 10 pounds (gallon milk jug).  Where possible, avoid household activities that involve lifting, bending, reaching, pushing, or pulling such as laundry, vacuuming, grocery shopping, and childcare. Try to arrange for help from friends and family for these activities while your back heals.  Increase physical activity slowly as tolerated.  Taking short walks is encouraged, but avoid strenuous exercise. Do not jog, run, bicycle, lift weights, or participate in any other exercises unless specifically allowed by your doctor.  Talk to your doctor before resuming sexual activity.  You should not drive until cleared by your doctor.  Until released by your doctor, you should not return to work or school.  You should rest at home and let your body heal.   You may shower three days after your surgery.  After showering, lightly dab your incision dry. Do not take a tub bath or go swimming until approved by your  doctor at your follow-up appointment.  If your doctor ordered a cervical collar (neck brace) for you, you should wear it whenever you are out of bed. You may remove it when lying down or sleeping, but you should wear it at all other times. Not all neck surgeries require a cervical collar.  If you smoke, we strongly recommend that you quit.  Smoking has been proven to interfere with normal bone healing and will dramatically reduce the success rate of your surgery. Please contact [facility] (800-QUIT-NOW) and use the resources at www.[facility].com for assistance in stopping smoking.  Surgical Incision   If you have a dressing on your incision, you may remove it two days after your surgery. Keep your incision area clean and dry.  If you have staples or stitches on your incision, you should have a follow up scheduled for removal. If you do not have staples or stitches, you will have steri-strips (small pieces of surgical tape) or Dermabond glue. The steri-strips/glue should begin to peel away within about a week (it is fine if the steri-strips fall off before then). If the strips are still in place one week after your surgery, you may gently remove them.  Diet           You may return to your usual diet. However, you may experience discomfort when swallowing in the first month after your surgery. This is normal. You may find that softer foods are more comfortable for you to swallow. Be sure to stay hydrated.  When to Contact Us  You may experience pain in your   neck and/or pain between your shoulder blades. This is normal and should improve in the next few weeks with the help of pain medication, muscle relaxers, and rest. Some patients report that a warm compress on the back of the neck or between the shoulder blades helps.  However, should you experience any of the following, contact us immediately: New numbness or weakness Pain that is progressively getting worse, and is not relieved by your pain  medication, muscle relaxers, rest, and warm compresses Bleeding, redness, swelling, pain, or drainage from surgical incision Chills or flu-like symptoms Fever greater than 101.0 F (38.3 C) Inability to eat, drink fluids, or take medications Problems with bowel or bladder functions Difficulty breathing or shortness of breath Warmth, tenderness, or swelling in your calf Contact Information How to contact us:  If you have any questions/concerns before or after surgery, you can reach [place] at [phone], or you can send a mychart message. We can be reached by phone or mychart 8am-4pm, Monday-Friday.  *Please note: Calls after 4pm are forwarded to a third party answering service. Mychart messages are not routinely monitored during evenings, weekends, and holidays. Please call our office to contact the answering service for urgent concerns during non-business hours.      AMBULATORY SURGERY  DISCHARGE INSTRUCTIONS   The drugs that you were given will stay in your system until tomorrow so for the next 24 hours you should not:  Drive an automobile Make any legal decisions Drink any alcoholic beverage   You may resume regular meals tomorrow.  Today it is better to start with liquids and gradually work up to solid foods.  You may eat anything you prefer, but it is better to start with liquids, then soup and crackers, and gradually work up to solid foods.   Please notify your doctor immediately if you have any unusual bleeding, trouble breathing, redness and pain at the surgery site, drainage, fever, or pain not relieved by medication.      Additional Instructions:

## 2022-10-11 ENCOUNTER — Encounter: Payer: Self-pay | Admitting: Neurosurgery

## 2022-10-13 ENCOUNTER — Emergency Department: Payer: BC Managed Care – PPO

## 2022-10-13 ENCOUNTER — Other Ambulatory Visit: Payer: Self-pay

## 2022-10-13 ENCOUNTER — Emergency Department
Admission: EM | Admit: 2022-10-13 | Discharge: 2022-10-13 | Disposition: A | Discharge status: Home / Self Care | Payer: BC Managed Care – PPO | Attending: Emergency Medicine | Admitting: Emergency Medicine

## 2022-10-13 DIAGNOSIS — R221 Localized swelling, mass and lump, neck: Secondary | ICD-10-CM | POA: Insufficient documentation

## 2022-10-13 DIAGNOSIS — L7632 Postprocedural hematoma of skin and subcutaneous tissue following other procedure: Secondary | ICD-10-CM | POA: Insufficient documentation

## 2022-10-13 DIAGNOSIS — Z9889 Other specified postprocedural states: Secondary | ICD-10-CM

## 2022-10-13 LAB — BASIC METABOLIC PANEL
Anion gap: 8 (ref 5–15)
BUN: 20 mg/dL (ref 8–23)
CO2: 21 mmol/L — ABNORMAL LOW (ref 22–32)
Calcium: 9.4 mg/dL (ref 8.9–10.3)
Chloride: 104 mmol/L (ref 98–111)
Creatinine, Ser: 1.04 mg/dL (ref 0.61–1.24)
GFR, Estimated: >60 mL/min (ref >60)
Glucose, Bld: 105 mg/dL — ABNORMAL HIGH (ref 70–99)
Potassium: 3.8 mmol/L (ref 3.5–5.1)
Sodium: 133 mmol/L — ABNORMAL LOW (ref 135–145)

## 2022-10-13 LAB — CBC
HCT: 46.1 % (ref 39.0–52.0)
Hemoglobin: 14.7 g/dL (ref 13.0–17.0)
MCH: 28.8 pg (ref 26.0–34.0)
MCHC: 31.9 g/dL (ref 30.0–36.0)
MCV: 90.4 fL (ref 80.0–100.0)
Platelets: 184 10*3/uL (ref 150–400)
RBC: 5.1 MIL/uL (ref 4.22–5.81)
RDW: 14.2 % (ref 11.5–15.5)
WBC: 10.7 10*3/uL — ABNORMAL HIGH (ref 4.0–10.5)
nRBC: 0 % (ref 0.0–0.2)

## 2022-10-13 MED ORDER — IOHEXOL 300 MG/ML  SOLN
75.0000 mL | Freq: Once | INTRAMUSCULAR | Status: AC | PRN
  Administered 2022-10-13: 75 mL via INTRAVENOUS

## 2022-10-13 NOTE — ED Notes (Signed)
Patient transported to CT 

## 2022-10-13 NOTE — ED Provider Notes (Signed)
[facility] Provider Note    Event Date/Time   First MD Initiated Contact with Patient 10/13/22 1000     (approximate)   History   No chief complaint on file.   HPI  KERT MAKOSKY is a 72 y.o. male with history of cervical radiculopathy s/p ACDF on 7/1 presenting to the emergency department for evaluation of anterior neck swelling.  Yesterday, patient began to notice swelling of his anterior neck.  Today, feels like the swelling is worsened.  He is concerned for hematoma.  Is able to swallow and manage his secretions, but notes that he is having to chew food more in order to be able to swallow it.   No difficulty breathing.  No fevers or chills.     Physical Exam   Triage Vital Signs: ED Triage Vitals [10/13/22 0954]  Enc Vitals Group     BP 100/61     Pulse Rate 76     Resp 20     Temp 98.4 F (36.9 C)     Temp src      SpO2 93 %     Weight 278 lb (126.1 kg)     Height 6' (1.829 m)     Head Circumference      Peak Flow      Pain Score 4     Pain Loc      Pain Edu?      Excl. in GC?     Most recent vital signs: Vitals:   10/13/22 0954 10/13/22 1107  BP: 100/61 (!) 96/57  Pulse: 76 63  Resp: 20 17  Temp: 98.4 F (36.9 C)   SpO2: 93% 96%     General: Awake, interactive  HEENT: Oropharynx clear.  There is swelling and ecchymosis over the anterior neck primarily on the left side.  Cervical incision appears intact without surrounding drainage.  Area is not significantly warm to palpation.  There does appear to be a palpable area of swelling over the anterior neck just left of midline.  Patient is managing secretions without issue.  Able to tolerate laying flat. CV:  Regular rate, good peripheral perfusion.  Resp:  Lungs clear, unlabored respirations.  Abd:  Soft, nondistended.  Neuro:  Symmetric facial movement, fluid speech     ED Results / Procedures / Treatments   Labs (all labs ordered are listed, but only abnormal results  are displayed) Labs Reviewed  CBC - Abnormal; Notable for the following components:      Result Value   WBC 10.7 (*)    All other components within normal limits  BASIC METABOLIC PANEL - Abnormal; Notable for the following components:   Sodium 133 (*)    CO2 21 (*)    Glucose, Bld 105 (*)    All other components within normal limits     EKG EKG independently reviewed interpreted by myself (ER attending) demonstrates:    RADIOLOGY Imaging independently reviewed and interpreted by myself demonstrates:  CT soft tissue neck demonstrates postoperative edema without discrete fluid collection  PROCEDURES:  Critical Care performed: No  Procedures   MEDICATIONS ORDERED IN ED: Medications  iohexol (OMNIPAQUE) 300 MG/ML solution 75 mL (75 mLs Intravenous Contrast Given 10/13/22 1025)     IMPRESSION / MDM / ASSESSMENT AND PLAN / ED COURSE  I reviewed the triage vital signs and the nursing notes.  Differential diagnosis includes, but is not limited to, postop hematoma, normal postoperative swelling, abscess, lymphadenopathy  Patient's presentation  is most consistent with acute presentation with potential threat to life or bodily function.  72 year old male presenting with anterior neck swelling and ecchymosis after recent cervical surgery.  Blood work with mild leukocytosis with WBC of 10.7, otherwise without severe derangement.  CT of the neck obtained to further evaluate which demonstrates incisional edema and fluid without organized fluid collection.  Case was reviewed with Dr. Katrinka Blazing with neurosurgery. We discussed steroids, he reported that he would contact Dr. Marcell Barlow to see if he recommended this.  He did feel that the patient could be discharged home and he will contact the patient to send a prescription if needed.  Patient comfortable with this plan.  Strict return precautions provided.  Patient discharged in stable condition.    FINAL CLINICAL IMPRESSION(S) / ED DIAGNOSES    Final diagnoses:  Neck swelling  Post-operative state     Rx / DC Orders   ED Discharge Orders     None        Note:  This document was prepared using Dragon voice recognition software and may include unintentional dictation errors.   Trinna Post, MD 10/13/22 [phone]

## 2022-10-13 NOTE — Discharge Instructions (Signed)
You were seen in the emergency department today for your neck swelling.  Your CT scan fortunately did not show a hematoma or abscess.  We reviewed your case with our neurosurgery team.  They will contact you and send a prescription if they recommend steroids.  Please contact Dr. Marcell Barlow to arrange follow-up with him.  Return to the ER for new or worsening symptoms including difficulty breathing, increased difficulty swallowing, or any other new or concerning symptoms.

## 2022-10-13 NOTE — ED Triage Notes (Signed)
Pt to ED for hematoma and swelling to left side of throat after having surgery to C3-5 on Monday. Reports noticed swelling yesterday. Reports having hard time swallowing saliva.

## 2022-10-13 NOTE — ED Notes (Signed)
Pt ambulated to the restroom with a steady gait.

## 2022-10-14 ENCOUNTER — Telehealth: Payer: Self-pay

## 2022-10-14 MED ORDER — METHYLPREDNISOLONE 4 MG PO TBPK: ORAL_TABLET | ORAL | 0 refills | Status: DC

## 2022-10-14 NOTE — Telephone Encounter (Signed)
Pt's spouse left voicemail requesting that we send in steroids. Discussed with Dr Myer Haff and he is OK with this. I notified Mr Hainsworth that the rx has been sent.

## 2022-10-18 ENCOUNTER — Encounter (INDEPENDENT_AMBULATORY_CARE_PROVIDER_SITE_OTHER): Payer: Medicare Other | Admitting: Vascular Surgery

## 2022-10-24 NOTE — Progress Notes (Unsigned)
   REFERRING PHYSICIAN:  Marguarite Arbour, Md [address] [facility] [place],  [place] [address]  DOS: ACDF C3-C5 on 10/10/22  HISTORY OF PRESENT ILLNESS: Paul Byrd is 2 weeks status post ACDF C3-C5. Given robaxin and oxycodone on discharge from the hospital.   He was seen in ED on POD #3 (10/13/22) for neck swelling. CT showed postop edema. Dr. Katrinka Blazing was contacted and he recommended patient be discharged home. Medrol dose pack was sent in for him on 10/14/22.         PHYSICAL EXAMINATION:  NEUROLOGICAL:  General: In no acute distress.   Awake, alert, oriented to person, place, and time.  Pupils equal round and reactive to light.  Facial tone is symmetric.    Strength: Side Biceps Triceps Deltoid Interossei Grip Wrist Ext. Wrist Flex.  R 5 5 5 5 5 5 5   L 5 5 5 5 5 5 5    Incision c/d/i  Imaging:  Nothing new to review.   Assessment / Plan: ELIZER BOSTIC is doing well s/p above surgery. Treatment options reviewed with patient and following plan made:   - We discussed activity escalation and I have advised the patient to lift up to 10 pounds until 6 weeks after surgery (until follow up with Dr. Myer Haff).   - Reviewed wound care.  - Continue current medications including *** - Follow up as scheduled in 4 weeks and prn.   Advised to contact the office if any questions or concerns arise.   Drake Leach PA-C Dept of Neurosurgery

## 2022-10-26 ENCOUNTER — Ambulatory Visit: Payer: Medicare Other | Admitting: Orthopedic Surgery

## 2022-10-26 ENCOUNTER — Encounter: Payer: Self-pay | Admitting: Orthopedic Surgery

## 2022-10-26 VITALS — BP 122/78 | Temp 98.0°F | Ht 72.0 in | Wt 278.0 lb

## 2022-10-26 DIAGNOSIS — M4802 Spinal stenosis, cervical region: Secondary | ICD-10-CM

## 2022-10-26 DIAGNOSIS — Z09 Encounter for follow-up examination after completed treatment for conditions other than malignant neoplasm: Secondary | ICD-10-CM

## 2022-10-26 DIAGNOSIS — Z981 Arthrodesis status: Secondary | ICD-10-CM

## 2022-10-26 DIAGNOSIS — M5412 Radiculopathy, cervical region: Secondary | ICD-10-CM

## 2022-10-28 ENCOUNTER — Ambulatory Visit (INDEPENDENT_AMBULATORY_CARE_PROVIDER_SITE_OTHER): Payer: BC Managed Care – PPO | Admitting: Vascular Surgery

## 2022-10-28 VITALS — BP 106/69 | HR 58 | Resp 18 | Ht 72.0 in | Wt 273.0 lb

## 2022-10-28 DIAGNOSIS — M7989 Other specified soft tissue disorders: Secondary | ICD-10-CM | POA: Diagnosis not present

## 2022-10-28 DIAGNOSIS — E785 Hyperlipidemia, unspecified: Secondary | ICD-10-CM | POA: Diagnosis not present
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
## 2022-10-31 ENCOUNTER — Telehealth: Payer: Self-pay | Admitting: Neurosurgery

## 2022-10-31 DIAGNOSIS — M7989 Other specified soft tissue disorders: Secondary | ICD-10-CM | POA: Insufficient documentation

## 2022-10-31 NOTE — Telephone Encounter (Signed)
C3-5 ACDF on 10/10/22 He is supposed to go to jury duty Wednesday. He would like to get an excuse for jury duty today. He is not able to sit or stand for a long period of time, and he is not able to do certain movements. He needs to pick this letter up today.

## 2022-10-31 NOTE — Assessment & Plan Note (Addendum)
lipid control important in reducing the progression of atherosclerotic disease.   

## 2022-10-31 NOTE — Telephone Encounter (Signed)
Please let him know the letter has been typed up.

## 2022-10-31 NOTE — Assessment & Plan Note (Signed)

## 2022-11-14 ENCOUNTER — Other Ambulatory Visit: Payer: Self-pay

## 2022-11-14 DIAGNOSIS — M5412 Radiculopathy, cervical region: Secondary | ICD-10-CM

## 2022-11-14 DIAGNOSIS — M4802 Spinal stenosis, cervical region: Secondary | ICD-10-CM

## 2022-11-22 ENCOUNTER — Encounter: Payer: Self-pay | Admitting: Neurosurgery

## 2022-11-22 ENCOUNTER — Ambulatory Visit (INDEPENDENT_AMBULATORY_CARE_PROVIDER_SITE_OTHER): Payer: BC Managed Care – PPO | Admitting: Neurosurgery

## 2022-11-22 ENCOUNTER — Ambulatory Visit
Admission: RE | Admit: 2022-11-22 | Discharge: 2022-11-22 | Disposition: A | Discharge status: Home / Self Care | Payer: BC Managed Care – PPO | Source: Ambulatory Visit | Attending: Neurosurgery | Admitting: Neurosurgery

## 2022-11-22 ENCOUNTER — Ambulatory Visit
Admission: RE | Admit: 2022-11-22 | Discharge: 2022-11-22 | Disposition: A | Discharge status: Home / Self Care | Payer: BC Managed Care – PPO | Attending: Neurosurgery | Admitting: Neurosurgery

## 2022-11-22 VITALS — BP 122/72 | Temp 98.0°F | Ht 72.0 in | Wt 273.0 lb

## 2022-11-22 DIAGNOSIS — M4802 Spinal stenosis, cervical region: Secondary | ICD-10-CM

## 2022-11-22 DIAGNOSIS — M5412 Radiculopathy, cervical region: Secondary | ICD-10-CM | POA: Insufficient documentation

## 2022-11-22 DIAGNOSIS — Z09 Encounter for follow-up examination after completed treatment for conditions other than malignant neoplasm: Secondary | ICD-10-CM

## 2022-11-22 NOTE — Progress Notes (Signed)
   REFERRING PHYSICIAN:  Marguarite Arbour, Md [address] [facility] [place],  [place] [address]  DOS: ACDF C3-C5 on 10/10/22  HISTORY OF PRESENT ILLNESS: Paul Byrd is status post ACDF C3-C5.   He is doing very well.  He has some change in his voice, but it is improving.  He has no swallowing difficulty.    PHYSICAL EXAMINATION:  NEUROLOGICAL:  General: In no acute distress.   Awake, alert, oriented to person, place, and time.  Pupils equal round and reactive to light.  Facial tone is symmetric.    Strength: Side Biceps Triceps Deltoid Interossei Grip Wrist Ext. Wrist Flex.  R 5 5 5 5 5 5 5   L 5 5 5 5 5 5 5    Incision c/d/i  Imaging:  No complications noted.  Assessment / Plan: Paul Byrd is doing well s/p above surgery.  I have given him some exercises for his neck.  I am very pleased with his response to surgery.  Will see him back in approximately 6 weeks.  I will release him to return to work tomorrow with restrictions.    Venetia Night MD Dept of Neurosurgery

## 2022-11-25 ENCOUNTER — Encounter (INDEPENDENT_AMBULATORY_CARE_PROVIDER_SITE_OTHER): Payer: Self-pay | Admitting: Nurse Practitioner

## 2022-11-25 ENCOUNTER — Ambulatory Visit (INDEPENDENT_AMBULATORY_CARE_PROVIDER_SITE_OTHER): Payer: BC Managed Care – PPO

## 2022-11-25 ENCOUNTER — Ambulatory Visit (INDEPENDENT_AMBULATORY_CARE_PROVIDER_SITE_OTHER): Payer: BC Managed Care – PPO | Admitting: Nurse Practitioner

## 2022-11-25 VITALS — BP 101/67 | HR 57 | Resp 18 | Ht 72.0 in | Wt 274.0 lb

## 2022-11-25 DIAGNOSIS — I890 Lymphedema, not elsewhere classified: Secondary | ICD-10-CM | POA: Diagnosis not present

## 2022-11-25 DIAGNOSIS — I872 Venous insufficiency (chronic) (peripheral): Secondary | ICD-10-CM | POA: Diagnosis not present

## 2022-11-25 DIAGNOSIS — M7989 Other specified soft tissue disorders: Secondary | ICD-10-CM

## 2022-11-25 DIAGNOSIS — E785 Hyperlipidemia, unspecified: Secondary | ICD-10-CM
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
## 2022-12-20 ENCOUNTER — Other Ambulatory Visit: Payer: Self-pay

## 2022-12-20 ENCOUNTER — Ambulatory Visit: Payer: BC Managed Care – PPO | Admitting: Anesthesiology

## 2022-12-20 ENCOUNTER — Encounter: Payer: Self-pay | Admitting: Gastroenterology

## 2022-12-20 ENCOUNTER — Encounter
Admission: RE | Disposition: A | Discharge status: Home / Self Care | Payer: Self-pay | Source: Home / Self Care | Attending: Gastroenterology

## 2022-12-20 ENCOUNTER — Ambulatory Visit
Admission: RE | Admit: 2022-12-20 | Discharge: 2022-12-20 | Disposition: A | Discharge status: Home / Self Care | Payer: BC Managed Care – PPO | Attending: Gastroenterology | Admitting: Gastroenterology

## 2022-12-20 DIAGNOSIS — Z87891 Personal history of nicotine dependence: Secondary | ICD-10-CM | POA: Diagnosis not present

## 2022-12-20 DIAGNOSIS — Z905 Acquired absence of kidney: Secondary | ICD-10-CM | POA: Insufficient documentation

## 2022-12-20 DIAGNOSIS — K635 Polyp of colon: Secondary | ICD-10-CM

## 2022-12-20 DIAGNOSIS — K621 Rectal polyp: Secondary | ICD-10-CM | POA: Diagnosis not present

## 2022-12-20 DIAGNOSIS — Z1211 Encounter for screening for malignant neoplasm of colon: Secondary | ICD-10-CM | POA: Diagnosis present

## 2022-12-20 DIAGNOSIS — K641 Second degree hemorrhoids: Secondary | ICD-10-CM | POA: Diagnosis not present

## 2022-12-20 DIAGNOSIS — N189 Chronic kidney disease, unspecified: Secondary | ICD-10-CM | POA: Diagnosis not present

## 2022-12-20 DIAGNOSIS — K623 Rectal prolapse: Secondary | ICD-10-CM

## 2022-12-20 DIAGNOSIS — Z860100 Personal history of colon polyps, unspecified: Secondary | ICD-10-CM

## 2022-12-20 DIAGNOSIS — Z85528 Personal history of other malignant neoplasm of kidney: Secondary | ICD-10-CM | POA: Insufficient documentation

## 2022-12-20 DIAGNOSIS — F419 Anxiety disorder, unspecified: Secondary | ICD-10-CM | POA: Insufficient documentation

## 2022-12-20 DIAGNOSIS — M109 Gout, unspecified: Secondary | ICD-10-CM | POA: Insufficient documentation

## 2022-12-20 HISTORY — PX: COLONOSCOPY WITH PROPOFOL: SHX5780

## 2022-12-20 HISTORY — PX: POLYPECTOMY: SHX5525

## 2022-12-20 SURGERY — COLONOSCOPY WITH PROPOFOL
Anesthesia: General

## 2022-12-20 MED ORDER — PHENYLEPHRINE HCL (PRESSORS) 10 MG/ML IV SOLN
INTRAVENOUS | Status: DC | PRN
Start: 2022-12-20 — End: 2022-12-20
  Administered 2022-12-20: 160 ug via INTRAVENOUS

## 2022-12-20 MED ORDER — PROPOFOL 1000 MG/100ML IV EMUL
INTRAVENOUS | Status: AC
  Filled 2022-12-20: qty 100

## 2022-12-20 MED ORDER — LIDOCAINE HCL (CARDIAC) PF 100 MG/5ML IV SOSY
PREFILLED_SYRINGE | INTRAVENOUS | Status: DC | PRN
  Administered 2022-12-20: 100 mg via INTRAVENOUS

## 2022-12-20 MED ORDER — PROPOFOL 10 MG/ML IV BOLUS
INTRAVENOUS | Status: DC | PRN
  Administered 2022-12-20: 150 ug/kg/min via INTRAVENOUS

## 2022-12-20 MED ORDER — SODIUM CHLORIDE 0.9 % IV SOLN: INTRAVENOUS | Status: DC

## 2022-12-20 MED ORDER — EPHEDRINE SULFATE (PRESSORS) 50 MG/ML IJ SOLN
INTRAMUSCULAR | Status: DC | PRN
Start: 2022-12-20 — End: 2022-12-20
  Administered 2022-12-20: 5 mg via INTRAVENOUS
  Administered 2022-12-20 (×2): 10 mg via INTRAVENOUS

## 2022-12-20 MED ORDER — LIDOCAINE HCL (PF) 2 % IJ SOLN
INTRAMUSCULAR | Status: AC
  Filled 2022-12-20: qty 30

## 2022-12-20 NOTE — Anesthesia Preprocedure Evaluation (Signed)
Anesthesia Evaluation  Patient identified by MRN, date of birth, ID band Patient awake    Reviewed: Allergy & Precautions, NPO status , Patient's Chart, lab work & pertinent test results  History of Anesthesia Complications Negative for: history of anesthetic complications  Airway Mallampati: II   Neck ROM: Full    Dental  (+) Missing   Pulmonary neg shortness of breath, neg sleep apnea, neg COPD, neg recent URI, former smoker   Pulmonary exam normal breath sounds clear to auscultation       Cardiovascular Exercise Tolerance: Good negative cardio ROS Normal cardiovascular exam Rhythm:Regular Rate:Normal  ECG 09/28/22:  Normal sinus rhythm Nonspecific T wave abnormality Abnormal ECG When compared with ECG of 07-Aug-2012 09:14, No significant change was found   Neuro/Psych  Headaches, neg Seizures PSYCHIATRIC DISORDERS (PTSD) Anxiety        GI/Hepatic negative GI ROS,,,  Endo/Other  negative endocrine ROS  Obesity   Renal/GU Renal disease (kidney CA s/p left nephrectomy 2001)   BPH    Musculoskeletal  (+) Arthritis ,  Gout    Abdominal   Peds  Hematology negative hematology ROS (+)   Anesthesia Other Findings   Reproductive/Obstetrics                             Anesthesia Physical Anesthesia Plan  ASA: 2  Anesthesia Plan: General   Post-op Pain Management:    Induction: Intravenous  PONV Risk Score and Plan: 2 and Propofol infusion and TIVA  Airway Management Planned:   Additional Equipment:   Intra-op Plan:   Post-operative Plan:   Informed Consent: I have reviewed the patients History and Physical, chart, labs and discussed the procedure including the risks, benefits and alternatives for the proposed anesthesia with the patient or authorized representative who has indicated his/her understanding and acceptance.     Dental advisory given  Plan Discussed with:  CRNA  Anesthesia Plan Comments: (Patient consented for risks of anesthesia including but not limited to:  - adverse reactions to medications - damage to eyes, teeth, lips or other oral mucosa - nerve damage due to positioning  - sore throat or hoarseness - damage to heart, brain, nerves, lungs, other parts of body or loss of life  Informed patient about role of CRNA in peri- and intra-operative care.  Patient voiced understanding.)        Anesthesia Quick Evaluation

## 2022-12-20 NOTE — Transfer of Care (Signed)
Immediate Anesthesia Transfer of Care Note  Patient: Paul Byrd  Procedure(s) Performed: COLONOSCOPY WITH PROPOFOL POLYPECTOMY  Patient Location: PACU  Anesthesia Type:General  Level of Consciousness: drowsy  Airway & Oxygen Therapy: Patient Spontanous Breathing  Post-op Assessment: Report given to RN and Post -op Vital signs reviewed and stable  Post vital signs: stable  Last Vitals:  Vitals Value Taken Time  BP 83/45 12/20/22 0815  Temp 36.4 C 12/20/22 0814  Pulse 61 12/20/22 0816  Resp 16 12/20/22 0816  SpO2 98 % 12/20/22 0816  Vitals shown include unfiled device data.  Last Pain:  Vitals:   12/20/22 0814  TempSrc: Temporal  PainSc: 0-No pain         Complications: No notable events documented.
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
## 2022-12-20 NOTE — Op Note (Signed)
[facility] Gastroenterology Patient Name: Paul Byrd Procedure Date: 12/20/2022 7:20 AM MRN: [id number] Account #: [id number] Date of Birth: [birth year] Admit Type: Outpatient Age: 72 Room: [facility] ENDO ROOM 4 Gender: Male Note Status: Finalized Instrument Name: Prentice Docker [id number] Procedure:             Colonoscopy Indications:           High risk colon cancer surveillance: Personal history                         of colonic polyps Providers:             Midge Minium MD, MD Referring MD:          Duane Lope. Judithann Sheen, MD (Referring MD) Medicines:             Propofol per Anesthesia Complications:         No immediate complications. Procedure:             Pre-Anesthesia Assessment:                        - Prior to the procedure, a History and Physical was                         performed, and patient medications and allergies were                         reviewed. The patient's tolerance of previous                         anesthesia was also reviewed. The risks and benefits                         of the procedure and the sedation options and risks                         were discussed with the patient. All questions were                         answered, and informed consent was obtained. Prior                         Anticoagulants: The patient has taken no anticoagulant                         or antiplatelet agents. ASA Grade Assessment: II - A                         patient with mild systemic disease. After reviewing                         the risks and benefits, the patient was deemed in                         satisfactory condition to undergo the procedure.                        After obtaining informed consent, the colonoscope was  passed under direct vision. Throughout the procedure,                         the patient's blood pressure, pulse, and oxygen                         saturations were monitored continuously. The                          Colonoscope was introduced through the anus and                         advanced to the the cecum, identified by appendiceal                         orifice and ileocecal valve. The colonoscopy was                         performed without difficulty. The patient tolerated                         the procedure well. The quality of the bowel                         preparation was excellent. Findings:      The perianal and digital rectal examinations were normal.      Two sessile polyps were found in the rectum. The polyps were 3 to 5 mm       in size. These polyps were removed with a cold snare. Resection and       retrieval were complete.      A 5 mm polyp was found in the ascending colon. The polyp was sessile.       The polyp was removed with a cold snare. Resection and retrieval were       complete.      Non-bleeding internal hemorrhoids were found during retroflexion. The       hemorrhoids were Grade II (internal hemorrhoids that prolapse but reduce       spontaneously). Impression:            - Two 3 to 5 mm polyps in the rectum, removed with a                         cold snare. Resected and retrieved.                        - One 5 mm polyp in the ascending colon, removed with                         a cold snare. Resected and retrieved.                        - Non-bleeding internal hemorrhoids. Recommendation:        - Discharge patient to home.                        - Resume previous diet.                        -  Continue present medications.                        - Await pathology results.                        - Repeat colonoscopy in 5 years for surveillance. Procedure Code(s):     --- Professional ---                        [phone], Colonoscopy, flexible; with removal of                         tumor(s), polyp(s), or other lesion(s) by snare                         technique Diagnosis Code(s):     --- Professional ---                        Z86.010,  Personal history of colonic polyps                        D12.8, Benign neoplasm of rectum CPT copyright 2022 American Medical Association. All rights reserved. The codes documented in this report are preliminary and upon coder review may  be revised to meet current compliance requirements. Midge Minium MD, MD 12/20/2022 8:12:03 AM This report has been signed electronically. Number of Addenda: 0 Note Initiated On: 12/20/2022 7:20 AM Scope Withdrawal Time: 0 hours 12 minutes 18 seconds  Total Procedure Duration: 0 hours 16 minutes 59 seconds  Estimated Blood Loss:  Estimated blood loss: none.      [facility]

## 2022-12-21 ENCOUNTER — Encounter: Payer: Self-pay | Admitting: Gastroenterology

## 2022-12-21 LAB — SURGICAL PATHOLOGY

## 2022-12-22 ENCOUNTER — Encounter: Payer: Self-pay | Admitting: Gastroenterology

## 2022-12-23 NOTE — Progress Notes (Unsigned)
   REFERRING PHYSICIAN:  Marguarite Arbour, Md [address] [facility] [place],  [place] [address]  DOS: ACDF C3-C5 on 10/10/22  HISTORY OF PRESENT ILLNESS: Paul Byrd was doing well at his last visit. Had some voice changes that were improving. No swallowing difficulty. He was released to go back to work.   He has some left sided neck pain with some stiffness in the morning. He still notes some vocal changes. No arm pain. No numbness, tingling, or weakness.   He takes prn robaxin and neurontin.    PHYSICAL EXAMINATION:  NEUROLOGICAL:  General: In no acute distress.   Awake, alert, oriented to person, place, and time.  Pupils equal round and reactive to light.  Facial tone is symmetric.    Strength: Side Biceps Triceps Deltoid Interossei Grip Wrist Ext. Wrist Flex.  R 5 5 5 5 5 5 5   L 5 5 5 5 5 5 5    Incision well healed  Imaging:  Cervical xrays dated 12/29/22:  No complications.  Radiology report for above xrays not yet available.    Assessment / Plan: Paul Byrd is doing well s/p above surgery. Treatment options reviewed with patient and following plan made:   - He can return to activities slowly as tolerated.  - Referral to ENT for vocal changes [facility]).  - Follow up as scheduled in 6 months and prn.   Advised to contact the office if any questions or concerns arise.   Drake Leach PA-C Dept of Neurosurgery

## 2022-12-28 NOTE — Anesthesia Postprocedure Evaluation (Signed)
Anesthesia Post Note  Patient: Paul Byrd  Procedure(s) Performed: COLONOSCOPY WITH PROPOFOL POLYPECTOMY  Patient location during evaluation: Endoscopy Anesthesia Type: General Level of consciousness: awake and alert Pain management: pain level controlled Vital Signs Assessment: post-procedure vital signs reviewed and stable Respiratory status: spontaneous breathing, nonlabored ventilation, respiratory function stable and patient connected to nasal cannula oxygen Cardiovascular status: blood pressure returned to baseline and stable Postop Assessment: no apparent nausea or vomiting Anesthetic complications: no   There were no known notable events for this encounter.   Last Vitals:  Vitals:   12/20/22 0814 12/20/22 0825  BP: (!) 83/45 113/63  Pulse: (!) 58   Resp: 18   Temp: (!) 36.4 C   SpO2: 99%     Last Pain:  Vitals:   12/20/22 0834  TempSrc:   PainSc: 0-No pain                 Lenard Simmer

## 2022-12-29 ENCOUNTER — Ambulatory Visit
Admission: RE | Admit: 2022-12-29 | Discharge: 2022-12-29 | Disposition: A | Discharge status: Home / Self Care | Payer: BC Managed Care – PPO | Attending: Neurosurgery | Admitting: Neurosurgery

## 2022-12-29 ENCOUNTER — Ambulatory Visit
Admission: RE | Admit: 2022-12-29 | Discharge: 2022-12-29 | Disposition: A | Discharge status: Home / Self Care | Payer: BC Managed Care – PPO | Source: Ambulatory Visit | Attending: Neurosurgery | Admitting: Neurosurgery

## 2022-12-29 ENCOUNTER — Ambulatory Visit: Payer: BC Managed Care – PPO | Admitting: Orthopedic Surgery

## 2022-12-29 ENCOUNTER — Encounter: Payer: Self-pay | Admitting: Orthopedic Surgery

## 2022-12-29 VITALS — BP 127/72 | Ht 72.0 in | Wt 266.6 lb

## 2022-12-29 DIAGNOSIS — M4802 Spinal stenosis, cervical region: Secondary | ICD-10-CM | POA: Insufficient documentation

## 2022-12-29 DIAGNOSIS — M5412 Radiculopathy, cervical region: Secondary | ICD-10-CM | POA: Diagnosis present

## 2022-12-29 DIAGNOSIS — Z981 Arthrodesis status: Secondary | ICD-10-CM

## 2022-12-29 DIAGNOSIS — Z09 Encounter for follow-up examination after completed treatment for conditions other than malignant neoplasm: Secondary | ICD-10-CM

## 2022-12-29 NOTE — Patient Instructions (Addendum)
It was so nice to see you today. Thank you so much for coming in.    I want you to see ENT in [facility] for your voice. I sent a referral to Dr. Irene Pap. They should call you to schedule an appointment or you can call them at [phone].   Dr. Myer Haff will see you back in 6 months. You will need xrays prior to this visit.   Please do not hesitate to call if you have any questions or concerns. You can also message me in MyChart.   Happy Retirement!  Drake Leach PA-C [phone]

## 2023-03-03 ENCOUNTER — Encounter (INDEPENDENT_AMBULATORY_CARE_PROVIDER_SITE_OTHER): Payer: Self-pay

## 2023-03-06 ENCOUNTER — Ambulatory Visit (INDEPENDENT_AMBULATORY_CARE_PROVIDER_SITE_OTHER): Payer: BC Managed Care – PPO | Admitting: Otolaryngology

## 2023-03-06 ENCOUNTER — Encounter (INDEPENDENT_AMBULATORY_CARE_PROVIDER_SITE_OTHER): Payer: Self-pay | Admitting: Otolaryngology

## 2023-03-06 VITALS — BP 114/72 | HR 66 | Ht 72.0 in | Wt 273.0 lb

## 2023-03-06 DIAGNOSIS — J3089 Other allergic rhinitis: Secondary | ICD-10-CM

## 2023-03-06 DIAGNOSIS — K219 Gastro-esophageal reflux disease without esophagitis: Secondary | ICD-10-CM | POA: Diagnosis not present

## 2023-03-06 DIAGNOSIS — J343 Hypertrophy of nasal turbinates: Secondary | ICD-10-CM

## 2023-03-06 DIAGNOSIS — J019 Acute sinusitis, unspecified: Secondary | ICD-10-CM | POA: Diagnosis not present

## 2023-03-06 DIAGNOSIS — R0981 Nasal congestion: Secondary | ICD-10-CM | POA: Diagnosis not present

## 2023-03-06 DIAGNOSIS — R0982 Postnasal drip: Secondary | ICD-10-CM

## 2023-03-06 DIAGNOSIS — R490 Dysphonia: Secondary | ICD-10-CM | POA: Diagnosis not present

## 2023-03-06 DIAGNOSIS — J383 Other diseases of vocal cords: Secondary | ICD-10-CM

## 2023-03-06 DIAGNOSIS — Z9889 Other specified postprocedural states: Secondary | ICD-10-CM

## 2023-03-06 DIAGNOSIS — J3489 Other specified disorders of nose and nasal sinuses: Secondary | ICD-10-CM

## 2023-03-06 MED ORDER — METHYLPREDNISOLONE 4 MG PO TBPK: ORAL_TABLET | ORAL | 1 refills | Status: DC

## 2023-03-06 MED ORDER — FLUTICASONE PROPIONATE 50 MCG/ACT NA SUSP: 2.0000 | Freq: Every day | NASAL | 6 refills | Status: DC

## 2023-03-06 MED ORDER — CETIRIZINE HCL 10 MG PO TABS: 10.0000 mg | ORAL_TABLET | Freq: Every day | ORAL | 11 refills | Status: AC

## 2023-03-06 MED ORDER — FAMOTIDINE 20 MG PO TABS: 20.0000 mg | ORAL_TABLET | Freq: Two times a day (BID) | ORAL | 3 refills | Status: DC

## 2023-03-06 MED ORDER — AMOXICILLIN-POT CLAVULANATE 875-125 MG PO TABS
1.0000 | ORAL_TABLET | Freq: Two times a day (BID) | ORAL | 0 refills | Status: DC
Start: 2023-03-06 — End: 2023-06-27

## 2023-03-06 NOTE — Patient Instructions (Addendum)
-   start Famotidine and Reflux Gourmet  - take Augmentin and Medrol pack for sinus infection  - start nasal saline rinses for nasal congestion  - start Zyrtec and Flonase for nasal congestion  - schedule voice therapy   - Take Reflux Gourmet (natural supplement available on Amazon) to help with symptoms of chronic throat irritation      Lloyd Huger Med Nasal Saline Rinse   - start nasal saline rinses with NeilMed Bottle available over the counter or online to help with nasal congestion
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
## 2023-03-16 ENCOUNTER — Encounter: Payer: Self-pay | Admitting: Speech Pathology

## 2023-03-16 ENCOUNTER — Ambulatory Visit: Payer: BC Managed Care – PPO | Attending: Otolaryngology | Admitting: Speech Pathology

## 2023-03-16 DIAGNOSIS — R490 Dysphonia: Secondary | ICD-10-CM | POA: Diagnosis present

## 2023-03-30 ENCOUNTER — Ambulatory Visit: Payer: BC Managed Care – PPO | Admitting: Speech Pathology

## 2023-04-13 ENCOUNTER — Ambulatory Visit: Payer: TRICARE For Life (TFL) | Attending: Otolaryngology | Admitting: Speech Pathology

## 2023-04-13 DIAGNOSIS — R490 Dysphonia: Secondary | ICD-10-CM | POA: Diagnosis present
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
## 2023-04-18 ENCOUNTER — Ambulatory Visit: Payer: TRICARE For Life (TFL) | Admitting: Speech Pathology

## 2023-04-27 ENCOUNTER — Ambulatory Visit: Payer: TRICARE For Life (TFL) | Admitting: Speech Pathology

## 2023-04-27 DIAGNOSIS — R490 Dysphonia: Secondary | ICD-10-CM
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
## 2023-04-27 NOTE — Addendum Note (Signed)
Addended by: Maia Breslow on: 04/27/2023 08:42 AM   Modules accepted: Orders

## 2023-04-27 NOTE — Patient Instructions (Signed)
For all exercises: deep belly breaths   Straw Warm Up: Sustained phonation low, mid, high x5  Sirens x5 Hum a song -- Happy Birthday   Voice Exercises: Sustained phonation low, mid, high Glides up and down  X5  Reading: Low pitch, loud voice  High pitch, calling voice
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
## 2023-05-03 ENCOUNTER — Ambulatory Visit: Payer: TRICARE For Life (TFL)

## 2023-05-03 DIAGNOSIS — R490 Dysphonia: Secondary | ICD-10-CM | POA: Diagnosis not present

## 2023-05-03 NOTE — Patient Instructions (Signed)
Practice 10 abdominal breaths  Straw phonation  2 minutes prior to PHoRTE and throughout the day and before any prolonged speaking  PHoRTE   [email or web address]   10 Loud AH's as loud as you can and as long as you can  2. 10 Pitch glides up, 10 pitch glides down   3. 10 sentences in loud high pitch voice, like you are calling your neighbor over the phone  4. 10 sentences in loud low authoritative pitch, like you are the boss  Use a good belly breath before each exercise- feel your abs contract in as you use your voice

## 2023-05-07 ENCOUNTER — Other Ambulatory Visit (INDEPENDENT_AMBULATORY_CARE_PROVIDER_SITE_OTHER): Payer: Self-pay | Admitting: Otolaryngology

## 2023-05-16 ENCOUNTER — Ambulatory Visit: Payer: TRICARE For Life (TFL) | Attending: Otolaryngology | Admitting: Speech Pathology

## 2023-05-16 DIAGNOSIS — R490 Dysphonia: Secondary | ICD-10-CM | POA: Diagnosis present
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
## 2023-06-08 ENCOUNTER — Ambulatory Visit (INDEPENDENT_AMBULATORY_CARE_PROVIDER_SITE_OTHER): Payer: Medicare Other | Admitting: Otolaryngology

## 2023-06-09 ENCOUNTER — Ambulatory Visit (INDEPENDENT_AMBULATORY_CARE_PROVIDER_SITE_OTHER): Payer: Medicare Other | Admitting: Otolaryngology

## 2023-06-14 ENCOUNTER — Other Ambulatory Visit: Payer: Self-pay | Admitting: Family Medicine

## 2023-06-14 DIAGNOSIS — M5416 Radiculopathy, lumbar region: Secondary | ICD-10-CM

## 2023-06-23 ENCOUNTER — Ambulatory Visit
Admission: RE | Admit: 2023-06-23 | Discharge: 2023-06-23 | Disposition: A | Discharge status: Home / Self Care | Source: Ambulatory Visit | Attending: Family Medicine | Admitting: Family Medicine

## 2023-06-23 DIAGNOSIS — M5416 Radiculopathy, lumbar region: Secondary | ICD-10-CM

## 2023-06-26 ENCOUNTER — Other Ambulatory Visit: Payer: Self-pay | Admitting: Family Medicine

## 2023-06-26 DIAGNOSIS — M5412 Radiculopathy, cervical region: Secondary | ICD-10-CM

## 2023-06-27 ENCOUNTER — Ambulatory Visit
Admission: RE | Admit: 2023-06-27 | Discharge: 2023-06-27 | Disposition: A | Discharge status: Home / Self Care | Attending: Neurosurgery | Admitting: Neurosurgery

## 2023-06-27 ENCOUNTER — Encounter: Payer: Self-pay | Admitting: Neurosurgery

## 2023-06-27 ENCOUNTER — Ambulatory Visit (INDEPENDENT_AMBULATORY_CARE_PROVIDER_SITE_OTHER): Payer: Medicare Other | Admitting: Neurosurgery

## 2023-06-27 ENCOUNTER — Ambulatory Visit
Admission: RE | Admit: 2023-06-27 | Discharge: 2023-06-27 | Disposition: A | Discharge status: Home / Self Care | Source: Ambulatory Visit | Attending: Neurosurgery | Admitting: Neurosurgery

## 2023-06-27 VITALS — BP 104/62 | Ht 72.0 in | Wt 273.0 lb

## 2023-06-27 DIAGNOSIS — G8929 Other chronic pain: Secondary | ICD-10-CM | POA: Diagnosis not present

## 2023-06-27 DIAGNOSIS — Z981 Arthrodesis status: Secondary | ICD-10-CM | POA: Diagnosis not present

## 2023-06-27 DIAGNOSIS — M545 Low back pain, unspecified: Secondary | ICD-10-CM | POA: Diagnosis not present

## 2023-06-27 DIAGNOSIS — M5412 Radiculopathy, cervical region: Secondary | ICD-10-CM

## 2023-06-27 NOTE — Progress Notes (Signed)
   REFERRING PHYSICIAN:  Marguarite Arbour, Md [address] [facility] [place],  [place] [address]  DOS: ACDF C3-C5 on 10/10/22  HISTORY OF PRESENT ILLNESS: JHONATAN LOMELI is status post ACDF C3-C5.   He is doing very well.  He has some stiffness in his neck.  He had a brief bout with bilateral leg pain.   PHYSICAL EXAMINATION:  NEUROLOGICAL:  General: In no acute distress.   Awake, alert, oriented to person, place, and time.  Pupils equal round and reactive to light.  Facial tone is symmetric.    Strength: Side Biceps Triceps Deltoid Interossei Grip Wrist Ext. Wrist Flex.  R 5 5 5 5 5 5 5   L 5 5 5 5 5 5 5    Incision c/d/i  Imaging:  No complications noted.  L spine MRI reviewed as well - no significant foraminal or lateral recess stenosis to explain symptoms  Assessment / Plan: BRYER COZZOLINO is doing well s/p above surgery.  He is doing well from the surgery.  I will see him back on an as-needed basis.  For his back, I think he can continue injections as needed and physical therapy if needed.  I would not recommend consideration of surgery at this time.  He does have significant arthritic changes in his low back that are chronic in nature.     I spent a total of 15 minutes in this patient's care today. This time was spent reviewing pertinent records including imaging studies, obtaining and confirming history, performing a directed evaluation, formulating and discussing my recommendations, and documenting the visit within the medical record.     Venetia Night MD Dept of Neurosurgery

## 2023-07-03 ENCOUNTER — Other Ambulatory Visit (INDEPENDENT_AMBULATORY_CARE_PROVIDER_SITE_OTHER): Payer: Self-pay | Admitting: Otolaryngology

## 2023-07-05 ENCOUNTER — Ambulatory Visit (INDEPENDENT_AMBULATORY_CARE_PROVIDER_SITE_OTHER): Payer: Medicare Other | Admitting: Otolaryngology

## 2023-07-05 ENCOUNTER — Encounter (INDEPENDENT_AMBULATORY_CARE_PROVIDER_SITE_OTHER): Payer: Self-pay | Admitting: Otolaryngology

## 2023-07-05 VITALS — BP 122/80 | HR 102

## 2023-07-05 DIAGNOSIS — K219 Gastro-esophageal reflux disease without esophagitis: Secondary | ICD-10-CM

## 2023-07-05 DIAGNOSIS — R490 Dysphonia: Secondary | ICD-10-CM | POA: Diagnosis not present

## 2023-07-05 DIAGNOSIS — J343 Hypertrophy of nasal turbinates: Secondary | ICD-10-CM

## 2023-07-05 DIAGNOSIS — J383 Other diseases of vocal cords: Secondary | ICD-10-CM | POA: Diagnosis not present

## 2023-07-05 DIAGNOSIS — R0981 Nasal congestion: Secondary | ICD-10-CM

## 2023-07-05 DIAGNOSIS — J3089 Other allergic rhinitis: Secondary | ICD-10-CM

## 2023-07-05 DIAGNOSIS — R0982 Postnasal drip: Secondary | ICD-10-CM

## 2023-07-05 DIAGNOSIS — J3489 Other specified disorders of nose and nasal sinuses: Secondary | ICD-10-CM
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
## 2023-08-29 ENCOUNTER — Encounter (INDEPENDENT_AMBULATORY_CARE_PROVIDER_SITE_OTHER): Payer: Self-pay

## 2024-03-13 ENCOUNTER — Encounter: Payer: Self-pay | Admitting: Urology

## 2024-03-13 ENCOUNTER — Ambulatory Visit (INDEPENDENT_AMBULATORY_CARE_PROVIDER_SITE_OTHER): Admitting: Urology

## 2024-03-13 VITALS — BP 105/61 | HR 63 | Ht 74.0 in | Wt 261.0 lb

## 2024-03-13 DIAGNOSIS — N5201 Erectile dysfunction due to arterial insufficiency: Secondary | ICD-10-CM | POA: Diagnosis not present

## 2024-03-13 NOTE — Patient Instructions (Signed)
 [email or web address]
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
## 2024-05-04 ENCOUNTER — Other Ambulatory Visit: Payer: Self-pay

## 2024-05-04 ENCOUNTER — Ambulatory Visit: Admission: EM | Admit: 2024-05-04 | Discharge: 2024-05-04 | Disposition: A | Discharge status: Home / Self Care

## 2024-05-04 DIAGNOSIS — J069 Acute upper respiratory infection, unspecified: Secondary | ICD-10-CM | POA: Diagnosis not present

## 2024-05-04 LAB — POC COVID19/FLU A&B COMBO
Covid Antigen, POC: NEGATIVE
Influenza A Antigen, POC: NEGATIVE
Influenza B Antigen, POC: NEGATIVE

## 2024-05-04 MED ORDER — AMOXICILLIN-POT CLAVULANATE 875-125 MG PO TABS: 1.0000 | ORAL_TABLET | Freq: Two times a day (BID) | ORAL | 0 refills | Status: AC
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
## 2024-05-04 NOTE — Discharge Instructions (Addendum)
 Take the Augmentin  as directed.  Follow up with your primary care provider.  Go to the emergency department if you have worsening symptoms.

## 2024-05-04 NOTE — ED Triage Notes (Signed)
 Pt being seen in UC for sore throat, nasal congestion, and productive cough x6 days. Pt denies fevers. Pt denies otc medication. Pt reports spouse has been recently sick.
